# Patient Record
Sex: Male | Born: 1955 | Race: White | Hispanic: No | Marital: Married | Smoking: Never smoker
Health system: Southern US, Community
[De-identification: ages and names within clinical notes are randomized; demographics above are authoritative.]

## PROBLEM LIST (undated history)

## (undated) DIAGNOSIS — K219 Gastro-esophageal reflux disease without esophagitis: Secondary | ICD-10-CM

## (undated) DIAGNOSIS — J189 Pneumonia, unspecified organism: Secondary | ICD-10-CM

## (undated) HISTORY — PX: NO PAST SURGERIES: SHX2092

---

## 2000-04-09 ENCOUNTER — Ambulatory Visit (HOSPITAL_COMMUNITY): Admission: RE | Admit: 2000-04-09 | Discharge: 2000-04-09 | Payer: Self-pay | Admitting: Family Medicine

## 2000-04-09 ENCOUNTER — Encounter: Payer: Self-pay | Admitting: Family Medicine

## 2011-05-03 ENCOUNTER — Ambulatory Visit (INDEPENDENT_AMBULATORY_CARE_PROVIDER_SITE_OTHER): Payer: BC Managed Care – PPO

## 2011-05-03 DIAGNOSIS — J029 Acute pharyngitis, unspecified: Secondary | ICD-10-CM

## 2013-06-12 ENCOUNTER — Ambulatory Visit (INDEPENDENT_AMBULATORY_CARE_PROVIDER_SITE_OTHER): Payer: BC Managed Care – PPO | Admitting: Podiatry

## 2013-06-12 ENCOUNTER — Encounter: Payer: Self-pay | Admitting: Podiatry

## 2013-06-12 VITALS — BP 136/82 | HR 76 | Ht 68.0 in | Wt 166.0 lb

## 2013-06-12 DIAGNOSIS — B351 Tinea unguium: Secondary | ICD-10-CM | POA: Insufficient documentation

## 2013-06-12 MED ORDER — TERBINAFINE HCL 250 MG PO TABS: 250.0000 mg | ORAL_TABLET | Freq: Every day | ORAL | Status: DC

## 2013-06-12 NOTE — Progress Notes (Signed)
Subjective: 58 year old male presents with fungal nail problem. He is on feet 10-12 hours/day. Fungal problem on 1st and 5th toes bilateral. Used OTC clear liquid for nail and failed to improve. Wishes to try out oral medication at this time.   Review of Systems - General ROS: negative for - chills, fatigue, fever, sleep disturbance, weight gain or weight loss Ophthalmic ROS: negative ENT ROS: negative Endocrine ROS: negative Respiratory ROS: no cough, shortness of breath, or wheezing Cardiovascular ROS: no chest pain or dyspnea on exertion Gastrointestinal ROS: no abdominal pain, change in bowel habits, or black or bloody stools Genito-Urinary ROS: no dysuria, trouble voiding, or hematuria Musculoskeletal ROS: negative Neurological ROS: no TIA or stroke symptoms Dermatological ROS: negative  Objective: Fungal nails 1st and 5th bilateral.  Neurovascular status are within normal. Rectus foot without gross deformities.  Assessment: Onychomycosis 1st and 5th bilateral.   Plan: Reviewed clinical findings available treatment options. Blood drawn for Liver enzyme profile. Rx for Lamisil given.

## 2013-06-12 NOTE — Patient Instructions (Signed)
Seen for fungal nails. Treatment options reviewed. Lamisil tablet prescribed. Return in 6 weeks for repeat blood work.

## 2013-06-27 ENCOUNTER — Encounter: Payer: Self-pay | Admitting: *Deleted

## 2013-06-27 ENCOUNTER — Other Ambulatory Visit: Payer: Self-pay | Admitting: Podiatry

## 2013-07-28 ENCOUNTER — Ambulatory Visit (INDEPENDENT_AMBULATORY_CARE_PROVIDER_SITE_OTHER): Payer: BC Managed Care – PPO | Admitting: Podiatry

## 2013-07-28 ENCOUNTER — Encounter: Payer: Self-pay | Admitting: Podiatry

## 2013-07-28 VITALS — BP 116/80 | HR 59

## 2013-07-28 DIAGNOSIS — M79609 Pain in unspecified limb: Secondary | ICD-10-CM

## 2013-07-28 DIAGNOSIS — B351 Tinea unguium: Secondary | ICD-10-CM

## 2013-07-28 NOTE — Progress Notes (Signed)
Patient came in to have blood work done. He has taken Lamisil for toe nail fungus for a month. No new problems.  Blood drawn and sample sent to lab for Liver enzyme function.  Will repeat in a month.

## 2013-07-28 NOTE — Patient Instructions (Signed)
Follow up Lamisil treatment. Blood drawn for liver function. Repeat in one month.

## 2013-08-10 ENCOUNTER — Other Ambulatory Visit: Payer: Self-pay | Admitting: *Deleted

## 2013-08-25 ENCOUNTER — Encounter: Payer: Self-pay | Admitting: Podiatry

## 2013-08-25 ENCOUNTER — Ambulatory Visit (INDEPENDENT_AMBULATORY_CARE_PROVIDER_SITE_OTHER): Payer: BC Managed Care – PPO | Admitting: Podiatry

## 2013-08-25 VITALS — BP 117/72 | HR 61 | Ht 68.0 in | Wt 168.0 lb

## 2013-08-25 DIAGNOSIS — B351 Tinea unguium: Secondary | ICD-10-CM

## 2013-08-25 NOTE — Patient Instructions (Signed)
Blood drawn to monitor Hepatic function. Return in 2 month for follow up.

## 2013-08-25 NOTE — Progress Notes (Signed)
Follow up on Lamisil treatment on fungal nail. Doing well with medication. Here to have blood work repeated.

## 2013-09-04 ENCOUNTER — Other Ambulatory Visit: Payer: Self-pay | Admitting: *Deleted

## 2013-10-27 ENCOUNTER — Encounter: Payer: Self-pay | Admitting: Podiatry

## 2013-10-27 ENCOUNTER — Ambulatory Visit (INDEPENDENT_AMBULATORY_CARE_PROVIDER_SITE_OTHER): Payer: BC Managed Care – PPO | Admitting: Podiatry

## 2013-10-27 VITALS — BP 128/79 | HR 78

## 2013-10-27 DIAGNOSIS — B351 Tinea unguium: Secondary | ICD-10-CM

## 2013-10-27 MED ORDER — EFINACONAZOLE 10 % EX SOLN: 1.0000 | Freq: Every morning | CUTANEOUS | Status: DC

## 2013-10-27 NOTE — Patient Instructions (Signed)
Improved fungal nail. Will continue with Jublia.

## 2013-10-27 NOTE — Progress Notes (Signed)
Follow up visit after using Lamisil for 3 months. Stated that he noted of improvement in his toe nail fungus.   Objective: Clear normal nail plate at proximal 1/2.   Assessment: Improved fungal nail with Lamisil treatment.    Plan: Advised to continue antifungal treatment with topical. Jublia prescribed.

## 2013-12-07 ENCOUNTER — Telehealth: Payer: Self-pay | Admitting: *Deleted

## 2013-12-07 NOTE — Telephone Encounter (Signed)
Dr. Caffie Pinto , [place] called today, 12/07/13 . He never received his Jublia, wants the RX sent into his Pharmacy. I explained that it was very expensive, He actually got a coupon and wants to [place] it. I told him I would have you send it in to his pharmacy as requested, then go thru [facility] if his Ins doesn't cover.

## 2013-12-08 MED ORDER — EFINACONAZOLE 10 % EX SOLN: 1.0000 | Freq: Every morning | CUTANEOUS | Status: DC

## 2014-01-18 ENCOUNTER — Ambulatory Visit (INDEPENDENT_AMBULATORY_CARE_PROVIDER_SITE_OTHER): Payer: BC Managed Care – PPO | Admitting: Family Medicine

## 2014-01-18 VITALS — BP 140/90 | HR 77 | Temp 98.1°F | Resp 18 | Ht 68.0 in | Wt 164.0 lb

## 2014-01-18 DIAGNOSIS — S61216A Laceration without foreign body of right little finger without damage to nail, initial encounter: Secondary | ICD-10-CM

## 2014-01-18 DIAGNOSIS — M79644 Pain in right finger(s): Secondary | ICD-10-CM

## 2014-01-18 DIAGNOSIS — Z23 Encounter for immunization: Secondary | ICD-10-CM

## 2014-01-18 NOTE — Progress Notes (Signed)
Procedure: Consent obtained.  Metacarpal block with 2% Lidocaine.  Anesthesia locally obtained.  Wound cleaned with soap and water.  Wound explored.  No tendons visualized.  Slice-like superficial wound.  Superficial closure with 5-0 ethilon #3 horizontal mattress sutures.   Dressings placed.  Wound care discussed with patient.

## 2014-01-18 NOTE — Patient Instructions (Signed)

## 2014-01-18 NOTE — Progress Notes (Signed)
Subjective:    Patient ID: Ian Walsh, male    DOB: [birth year], 58 y.o.   MRN: [id number] This chart was scribed for Wendie Agreste, MD by Martinique Peace, ED Scribe. The patient was seen in [facility]07. The patient's care was started at 8:48 PM.  HPI HPI Comments: Ian Walsh is a 58 y.o. male who presents to the [facility] complaining of laceration to right 5th finger onset 10 or 11 this morning that occurred while pt was closing a gate. He states that his finger got caught between the gate and a post. He denies experiencing any bone pain and adds that he is able to use finger normally. Unknown last Tetanus but thinks more than 5 years ago. Pt states that he washed wound and applied bandage to affected area. No other treatments.   Patient Active Problem List   Diagnosis Date Noted  . Onychomycosis 06/12/2013   History reviewed. No pertinent past medical history. History reviewed. No pertinent past surgical history. Allergies  Allergen Reactions  . Codeine Nausea Only   Prior to Admission medications   Medication Sig Start Date End Date Taking? Authorizing Provider  aspirin 81 MG tablet Take 81 mg by mouth daily.   Yes Historical Provider, MD  Efinaconazole (JUBLIA) 10 % SOLN Apply 1 applicator topically every morning. 12/08/13   Myeong Sheard, DPM  Efinaconazole (JUBLIA) 10 % SOLN Apply 1 applicator topically every morning. 12/08/13   Myeong Sheard, DPM  terbinafine (LAMISIL) 250 MG tablet Take 1 tablet (250 mg total) by mouth daily. 06/12/13   Myeong Sheard, DPM   History   Social History  . Marital Status: Married    Spouse Name: N/A    Number of Children: N/A  . Years of Education: N/A   Occupational History  . Not on file.   Social History Main Topics  . Smoking status: Former Research scientist (life sciences)  . Smokeless tobacco: Never Used  . Alcohol Use: Not on file  . Drug Use: Not on file  . Sexual Activity: Not on file   Other Topics Concern  . Not on file   Social History Narrative  . No  narrative on file      Review of Systems  Skin: Positive for wound (laceration to right 5th finger).  Neurological: Negative for weakness.       Objective:   Physical Exam  Nursing note and vitals reviewed. Constitutional: He is oriented to person, place, and time. He appears well-developed and well-nourished. No distress.  HENT:  Head: Normocephalic and atraumatic.  Neck: Carotid bruit is not present.  Pulmonary/Chest: Effort normal and breath sounds normal. No respiratory distress.  Musculoskeletal: Normal range of motion. He exhibits no edema.  Right 5th finger, full flexion and extension strength at DIP and PIP. Full ROM, NVI distally. 2cm lac over volar 5th PIP with slight angle centrally. Widens with tension but hemostatic.   Neurological: He is alert and oriented to person, place, and time.  Skin: Skin is warm and dry.  Psychiatric: He has a normal mood and affect. His behavior is normal.    Medications - No data to display Filed Vitals:   01/18/14 2032  BP: 140/90  Pulse: 77  Temp: 98.1 F (36.7 C)  TempSrc: Oral  Resp: 18  Height: 5\' 8"  (1.727 m)  Weight: 164 lb (74.39 kg)  SpO2: 99%       Assessment & Plan:   Ian Walsh is a 58 y.o. male Laceration of finger,  right, initial encounter - Plan: Tdap vaccine greater than or equal to 7yo IM  Need for prophylactic vaccination with combined diphtheria-tetanus-pertussis (DTP) vaccine - Plan: Tdap vaccine greater than or equal to 7yo IM  Pain of finger of right hand Wound repaired per procedure note. tdap updated. No weakness or bony ttp. Declined XR.  Wound care and rtc precautions discussed.   No orders of the defined types were placed in this encounter.   Patient Instructions  WOUND CARE Please return in 10 days to have your stitches/staples removed or sooner if you have concerns. Marland Kitchen Keep area clean and dry for 24 hours. Do not remove bandage, if applied. . After 24 hours, remove bandage and wash  wound gently with mild soap and warm water. Reapply a new bandage after cleaning wound, if directed. . Continue daily cleansing with soap and water until stitches/staples are removed. . Do not apply any ointments or creams to the wound while stitches/staples are in place, as this may cause delayed healing. . Notify the office if you experience any of the following signs of infection: Swelling, redness, pus drainage, streaking, fever >101.0 F . Notify the office if you experience excessive bleeding that does not stop after 15-20 minutes of constant, firm pressure.      I personally performed the services described in this documentation, which was scribed in my presence. The recorded information has been reviewed and considered, and addended by me as needed.

## 2014-01-29 ENCOUNTER — Ambulatory Visit (INDEPENDENT_AMBULATORY_CARE_PROVIDER_SITE_OTHER): Payer: BC Managed Care – PPO | Admitting: Physician Assistant

## 2014-01-29 VITALS — BP 132/80 | HR 69 | Temp 98.8°F | Resp 16

## 2014-01-29 DIAGNOSIS — S61216D Laceration without foreign body of right little finger without damage to nail, subsequent encounter: Secondary | ICD-10-CM

## 2014-01-29 NOTE — Progress Notes (Signed)
   Subjective:    Patient ID: Ian Walsh, male    DOB: 1955/09/10, 58 y.o.   MRN: [id number]  Suture / Staple Removal  Patient presents to clinic for suture removal status post 11 days. Patient denies pain.     Review of Systems     Objective:   Physical Exam  Wound of finger clean, intact, and non erythematous. 3 horizontal mattress sutures removed.  Blood pressure 132/80, pulse 69, temperature 98.8 F (37.1 C), temperature source Oral, resp. rate 16, SpO2 98.00%.       Assessment & Plan:  Sutures removed. Procedure well tolerated. Local wound care follow up as needed.

## 2014-01-29 NOTE — Progress Notes (Signed)
I have discussed this patient with Ms. Ricki Miller, PA-C and agree.

## 2017-02-04 DIAGNOSIS — Z23 Encounter for immunization: Secondary | ICD-10-CM | POA: Diagnosis not present

## 2017-06-02 DIAGNOSIS — R739 Hyperglycemia, unspecified: Secondary | ICD-10-CM | POA: Diagnosis not present

## 2017-06-02 DIAGNOSIS — Z0000 Encounter for general adult medical examination without abnormal findings: Secondary | ICD-10-CM | POA: Diagnosis not present

## 2017-06-02 DIAGNOSIS — Z125 Encounter for screening for malignant neoplasm of prostate: Secondary | ICD-10-CM | POA: Diagnosis not present

## 2018-01-02 DIAGNOSIS — T1502XA Foreign body in cornea, left eye, initial encounter: Secondary | ICD-10-CM | POA: Diagnosis not present

## 2018-01-03 DIAGNOSIS — T1502XA Foreign body in cornea, left eye, initial encounter: Secondary | ICD-10-CM | POA: Diagnosis not present

## 2018-01-03 DIAGNOSIS — H5712 Ocular pain, left eye: Secondary | ICD-10-CM | POA: Diagnosis not present

## 2018-01-05 DIAGNOSIS — T1502XD Foreign body in cornea, left eye, subsequent encounter: Secondary | ICD-10-CM | POA: Diagnosis not present

## 2018-01-13 DIAGNOSIS — S0502XD Injury of conjunctiva and corneal abrasion without foreign body, left eye, subsequent encounter: Secondary | ICD-10-CM | POA: Diagnosis not present

## 2018-01-24 DIAGNOSIS — S0502XD Injury of conjunctiva and corneal abrasion without foreign body, left eye, subsequent encounter: Secondary | ICD-10-CM | POA: Diagnosis not present

## 2018-02-03 DIAGNOSIS — H18832 Recurrent erosion of cornea, left eye: Secondary | ICD-10-CM | POA: Diagnosis not present

## 2018-02-08 DIAGNOSIS — H18832 Recurrent erosion of cornea, left eye: Secondary | ICD-10-CM | POA: Diagnosis not present

## 2018-03-07 DIAGNOSIS — H18832 Recurrent erosion of cornea, left eye: Secondary | ICD-10-CM | POA: Diagnosis not present

## 2018-04-12 ENCOUNTER — Other Ambulatory Visit: Payer: Self-pay

## 2018-04-12 ENCOUNTER — Inpatient Hospital Stay (HOSPITAL_COMMUNITY): Payer: 59

## 2018-04-12 ENCOUNTER — Emergency Department (HOSPITAL_COMMUNITY): Payer: 59

## 2018-04-12 ENCOUNTER — Inpatient Hospital Stay (HOSPITAL_COMMUNITY)
Admission: EM | Admit: 2018-04-12 | Discharge: 2018-04-15 | DRG: 373 | Disposition: A | Discharge status: Home / Self Care | Payer: 59 | Attending: Surgery | Admitting: Surgery

## 2018-04-12 ENCOUNTER — Encounter (HOSPITAL_COMMUNITY): Payer: Self-pay | Admitting: Emergency Medicine

## 2018-04-12 DIAGNOSIS — K5900 Constipation, unspecified: Secondary | ICD-10-CM | POA: Diagnosis present

## 2018-04-12 DIAGNOSIS — Z87891 Personal history of nicotine dependence: Secondary | ICD-10-CM | POA: Diagnosis not present

## 2018-04-12 DIAGNOSIS — Z79899 Other long term (current) drug therapy: Secondary | ICD-10-CM

## 2018-04-12 DIAGNOSIS — K219 Gastro-esophageal reflux disease without esophagitis: Secondary | ICD-10-CM | POA: Diagnosis present

## 2018-04-12 DIAGNOSIS — R911 Solitary pulmonary nodule: Secondary | ICD-10-CM | POA: Diagnosis present

## 2018-04-12 DIAGNOSIS — K3589 Other acute appendicitis without perforation or gangrene: Secondary | ICD-10-CM

## 2018-04-12 DIAGNOSIS — K3532 Acute appendicitis with perforation and localized peritonitis, without abscess: Secondary | ICD-10-CM | POA: Diagnosis present

## 2018-04-12 DIAGNOSIS — B9620 Unspecified Escherichia coli [E. coli] as the cause of diseases classified elsewhere: Secondary | ICD-10-CM | POA: Diagnosis present

## 2018-04-12 DIAGNOSIS — K449 Diaphragmatic hernia without obstruction or gangrene: Secondary | ICD-10-CM | POA: Diagnosis present

## 2018-04-12 DIAGNOSIS — Z7982 Long term (current) use of aspirin: Secondary | ICD-10-CM

## 2018-04-12 DIAGNOSIS — Z23 Encounter for immunization: Secondary | ICD-10-CM | POA: Diagnosis not present

## 2018-04-12 DIAGNOSIS — R1031 Right lower quadrant pain: Secondary | ICD-10-CM | POA: Diagnosis not present

## 2018-04-12 DIAGNOSIS — K3533 Acute appendicitis with perforation and localized peritonitis, with abscess: Principal | ICD-10-CM | POA: Diagnosis present

## 2018-04-12 HISTORY — DX: Pneumonia, unspecified organism: J18.9

## 2018-04-12 HISTORY — DX: Gastro-esophageal reflux disease without esophagitis: K21.9

## 2018-04-12 LAB — CBC WITH DIFFERENTIAL/PLATELET
Abs Immature Granulocytes: 0.09 10*3/uL — ABNORMAL HIGH (ref 0.00–0.07)
BASOS ABS: 0 10*3/uL (ref 0.0–0.1)
BASOS PCT: 0 %
EOS ABS: 0 10*3/uL (ref 0.0–0.5)
Eosinophils Relative: 0 %
HCT: 45.2 % (ref 39.0–52.0)
Hemoglobin: 15 g/dL (ref 13.0–17.0)
IMMATURE GRANULOCYTES: 1 %
Lymphocytes Relative: 9 %
Lymphs Abs: 1.3 10*3/uL (ref 0.7–4.0)
MCH: 31.3 pg (ref 26.0–34.0)
MCHC: 33.2 g/dL (ref 30.0–36.0)
MCV: 94.4 fL (ref 80.0–100.0)
Monocytes Absolute: 1.1 10*3/uL — ABNORMAL HIGH (ref 0.1–1.0)
Monocytes Relative: 8 %
NRBC: 0 % (ref 0.0–0.2)
Neutro Abs: 12 10*3/uL — ABNORMAL HIGH (ref 1.7–7.7)
Neutrophils Relative %: 82 %
PLATELETS: 378 10*3/uL (ref 150–400)
RBC: 4.79 MIL/uL (ref 4.22–5.81)
RDW: 11.8 % (ref 11.5–15.5)
WBC: 14.5 10*3/uL — AB (ref 4.0–10.5)

## 2018-04-12 LAB — BASIC METABOLIC PANEL
ANION GAP: 14 (ref 5–15)
BUN: 15 mg/dL (ref 6–20)
CALCIUM: 9.4 mg/dL (ref 8.9–10.3)
CO2: 26 mmol/L (ref 22–32)
CREATININE: 1.1 mg/dL (ref 0.61–1.24)
Chloride: 99 mmol/L (ref 98–111)
GFR calc non Af Amer: >60 mL/min (ref >60)
Glucose, Bld: 111 mg/dL — ABNORMAL HIGH (ref 70–99)
Potassium: 4.1 mmol/L (ref 3.5–5.1)
Sodium: 139 mmol/L (ref 135–145)

## 2018-04-12 LAB — URINALYSIS, ROUTINE W REFLEX MICROSCOPIC
BILIRUBIN URINE: NEGATIVE
Glucose, UA: NEGATIVE mg/dL
Hgb urine dipstick: NEGATIVE
Ketones, ur: 20 mg/dL — AB
Leukocytes, UA: NEGATIVE
Nitrite: NEGATIVE
Protein, ur: NEGATIVE mg/dL
Specific Gravity, Urine: >1.046 — ABNORMAL HIGH (ref 1.005–1.030)
pH: 5 (ref 5.0–8.0)

## 2018-04-12 LAB — CREATININE, SERUM
Creatinine, Ser: 1.08 mg/dL (ref 0.61–1.24)
GFR calc non Af Amer: >60 mL/min (ref >60)

## 2018-04-12 LAB — CBC
HEMATOCRIT: 41 % (ref 39.0–52.0)
Hemoglobin: 14.2 g/dL (ref 13.0–17.0)
MCH: 31.9 pg (ref 26.0–34.0)
MCHC: 34.6 g/dL (ref 30.0–36.0)
MCV: 92.1 fL (ref 80.0–100.0)
Platelets: 352 10*3/uL (ref 150–400)
RBC: 4.45 MIL/uL (ref 4.22–5.81)
RDW: 11.6 % (ref 11.5–15.5)
WBC: 13.7 10*3/uL — ABNORMAL HIGH (ref 4.0–10.5)
nRBC: 0 % (ref 0.0–0.2)

## 2018-04-12 LAB — PROTIME-INR
INR: 1.08
Prothrombin Time: 13.9 seconds (ref 11.4–15.2)

## 2018-04-12 MED ORDER — ONDANSETRON HCL 4 MG/2ML IJ SOLN
4.0000 mg | Freq: Four times a day (QID) | INTRAMUSCULAR | Status: DC | PRN
  Administered 2018-04-12: 4 mg via INTRAVENOUS
  Filled 2018-04-12: qty 2

## 2018-04-12 MED ORDER — DIPHENHYDRAMINE HCL 50 MG/ML IJ SOLN: 12.5000 mg | Freq: Four times a day (QID) | INTRAMUSCULAR | Status: DC | PRN

## 2018-04-12 MED ORDER — SODIUM CHLORIDE 0.9 % IV SOLN
INTRAVENOUS | Status: DC
  Administered 2018-04-12: 14:00:00 via INTRAVENOUS

## 2018-04-12 MED ORDER — INFLUENZA VAC SPLIT QUAD 0.5 ML IM SUSY
0.5000 mL | PREFILLED_SYRINGE | INTRAMUSCULAR | Status: AC
  Administered 2018-04-15: 0.5 mL via INTRAMUSCULAR
  Filled 2018-04-12 (×2): qty 0.5

## 2018-04-12 MED ORDER — KCL IN DEXTROSE-NACL 20-5-0.9 MEQ/L-%-% IV SOLN
INTRAVENOUS | Status: DC
  Administered 2018-04-12 – 2018-04-14 (×5): via INTRAVENOUS
  Filled 2018-04-12 (×9): qty 1000

## 2018-04-12 MED ORDER — LIDOCAINE HCL 1 % IJ SOLN
INTRAMUSCULAR | Status: AC
  Filled 2018-04-12: qty 20

## 2018-04-12 MED ORDER — ENOXAPARIN SODIUM 40 MG/0.4ML ~~LOC~~ SOLN
40.0000 mg | SUBCUTANEOUS | Status: DC
  Administered 2018-04-13 – 2018-04-15 (×3): 40 mg via SUBCUTANEOUS
  Filled 2018-04-12 (×3): qty 0.4

## 2018-04-12 MED ORDER — TRAMADOL HCL 50 MG PO TABS
50.0000 mg | ORAL_TABLET | Freq: Four times a day (QID) | ORAL | Status: DC | PRN
  Administered 2018-04-12: 50 mg via ORAL
  Filled 2018-04-12: qty 1

## 2018-04-12 MED ORDER — FENTANYL CITRATE (PF) 100 MCG/2ML IJ SOLN
INTRAMUSCULAR | Status: AC
  Filled 2018-04-12: qty 4

## 2018-04-12 MED ORDER — BOOST / RESOURCE BREEZE PO LIQD CUSTOM
1.0000 | Freq: Three times a day (TID) | ORAL | Status: DC
  Administered 2018-04-12 – 2018-04-14 (×5): 1 via ORAL

## 2018-04-12 MED ORDER — MIDAZOLAM HCL 2 MG/2ML IJ SOLN
INTRAMUSCULAR | Status: AC | PRN
  Administered 2018-04-12 (×2): 1 mg via INTRAVENOUS

## 2018-04-12 MED ORDER — IOHEXOL 300 MG/ML  SOLN
100.0000 mL | Freq: Once | INTRAMUSCULAR | Status: AC | PRN
  Administered 2018-04-12: 100 mL via INTRAVENOUS

## 2018-04-12 MED ORDER — ACETAMINOPHEN 650 MG RE SUPP: 650.0000 mg | Freq: Four times a day (QID) | RECTAL | Status: DC | PRN

## 2018-04-12 MED ORDER — ONDANSETRON 4 MG PO TBDP: 4.0000 mg | ORAL_TABLET | Freq: Four times a day (QID) | ORAL | Status: DC | PRN

## 2018-04-12 MED ORDER — MIDAZOLAM HCL 2 MG/2ML IJ SOLN
INTRAMUSCULAR | Status: AC
  Filled 2018-04-12: qty 4

## 2018-04-12 MED ORDER — SODIUM CHLORIDE 0.9% FLUSH
5.0000 mL | Freq: Three times a day (TID) | INTRAVENOUS | Status: DC
  Administered 2018-04-12 – 2018-04-15 (×9): 5 mL

## 2018-04-12 MED ORDER — MIDAZOLAM HCL 2 MG/2ML IJ SOLN
INTRAMUSCULAR | Status: AC
  Filled 2018-04-12: qty 2

## 2018-04-12 MED ORDER — HYDROMORPHONE HCL 1 MG/ML IJ SOLN: 0.5000 mg | INTRAMUSCULAR | Status: DC | PRN

## 2018-04-12 MED ORDER — PIPERACILLIN-TAZOBACTAM 3.375 G IVPB
3.3750 g | Freq: Three times a day (TID) | INTRAVENOUS | Status: DC
  Administered 2018-04-12 – 2018-04-15 (×8): 3.375 g via INTRAVENOUS
  Filled 2018-04-12 (×5): qty 50

## 2018-04-12 MED ORDER — PIPERACILLIN-TAZOBACTAM 3.375 G IVPB 30 MIN
3.3750 g | Freq: Once | INTRAVENOUS | Status: AC
  Administered 2018-04-12: 3.375 g via INTRAVENOUS
  Filled 2018-04-12: qty 50

## 2018-04-12 MED ORDER — DIPHENHYDRAMINE HCL 12.5 MG/5ML PO ELIX: 12.5000 mg | ORAL_SOLUTION | Freq: Four times a day (QID) | ORAL | Status: DC | PRN

## 2018-04-12 MED ORDER — FENTANYL CITRATE (PF) 100 MCG/2ML IJ SOLN
INTRAMUSCULAR | Status: AC | PRN
  Administered 2018-04-12 (×2): 25 ug via INTRAVENOUS
  Administered 2018-04-12: 50 ug via INTRAVENOUS

## 2018-04-12 MED ORDER — ACETAMINOPHEN 325 MG PO TABS
650.0000 mg | ORAL_TABLET | Freq: Four times a day (QID) | ORAL | Status: DC | PRN
  Administered 2018-04-12: 650 mg via ORAL
  Filled 2018-04-12: qty 2

## 2018-04-12 NOTE — ED Triage Notes (Signed)
Patient arrived from home reporting he went to urgent care and was sent to the ED because he may have appendictis.   Patient reports abdominal pain for a week with fever, he reported taking Naproxen and felt better for about 2 days but returned. He reports that he has not had any appetite, feeling constipated and he had to go to the Urgent Med this morning

## 2018-04-12 NOTE — Sedation Documentation (Signed)
Pt being prepped for procedure at this time 

## 2018-04-12 NOTE — Progress Notes (Signed)
Pt new admit from IR abscess drain mid abdomen with one JP drain, alert and oriented , independent, given 1x ultram for pain and relieved, wife at the bedside.

## 2018-04-12 NOTE — Consult Note (Signed)
Chief Complaint: Patient was seen in consultation today for RLQ abscess drain placement at the request of [facility]   Supervising Physician: Daryll Brod  Patient Status: [facility] - ED  History of Present Illness: Ian Walsh is a 62 y.o. male   abd pain x 1 week Intermittent RLQ pain Sent to ED from Urgent Care for possible appendicitis  Leukocytosis CT today:  IMPRESSION: 1. Heterogeneous periappendiceal fluid collection compatible with contained rupture/abscess measuring up to 6.6 cm. 2. Thickening the appendix compatible with acute/subacute appendicitis. 3. Secondary inflammatory changes involving the terminal ileum and sigmoid colon adjacent to the abscess. 4. Moderate-sized hiatal hernia. 5. Semi-solid right lower lobe pulmonary nodule measures 9 x 8 x 13 mm. Follow-up non-contrast CT recommended at 3-6 months to confirm persistence. If unchanged, and solid component remains <6 mm, annual CT is recommended until 5 years of stability has been established. If persistent these nodules should be considered highly suspicious if the solid component of the nodule is 6 mm or greater in size and enlarging.   Now scheduled for abscess drain placement Dr Annamaria Boots has reviewed imaging and approves procedure   Past Medical History:  Diagnosis Date  . GERD (gastroesophageal reflux disease)     History reviewed. No pertinent surgical history.  Allergies: Codeine  Medications: Prior to Admission medications   Medication Sig Start Date End Date Taking? Authorizing Provider  acetaminophen (TYLENOL) 500 MG tablet Take 1,000 mg by mouth every 6 (six) hours as needed for fever.   Yes [provider]  aspirin 81 MG tablet Take 81 mg by mouth daily.   Yes [provider]  calcium carbonate (TUMS - DOSED IN MG ELEMENTAL CALCIUM) 500 MG chewable tablet Chew 1,500 tablets by mouth as needed for indigestion or heartburn.   Yes [provider]  naproxen  (NAPROSYN) 500 MG tablet Take 250 mg by mouth as needed for mild pain or moderate pain.   Yes [provider]  omeprazole (PRILOSEC) 20 MG capsule Take 20 mg by mouth as needed (reflux).    Yes [provider]  polyethylene glycol (MIRALAX / GLYCOLAX) packet Take 17 g by mouth daily as needed for mild constipation or moderate constipation.   Yes [provider]  Efinaconazole (JUBLIA) 10 % SOLN Apply 1 applicator topically every morning. Patient not taking: Reported on 04/12/2018 12/08/13   Sheard, Myeong O, DPM  terbinafine (LAMISIL) 250 MG tablet Take 1 tablet (250 mg total) by mouth daily. Patient not taking: Reported on 04/12/2018 06/12/13   Camelia Phenes, DPM     History reviewed. No pertinent family history.  Social History   Socioeconomic History  . Marital status: Married    Spouse name: Not on file  . Number of children: Not on file  . Years of education: Not on file  . Highest education level: Not on file  Occupational History  . Not on file  Social Needs  . Financial resource strain: Not on file  . Food insecurity:    Worry: Not on file    Inability: Not on file  . Transportation needs:    Medical: Not on file    Non-medical: Not on file  Tobacco Use  . Smoking status: Former Research scientist (life sciences)  . Smokeless tobacco: Never Used  Substance and Sexual Activity  . Alcohol use: Yes    Comment: occasional  . Drug use: Never  . Sexual activity: Yes  Lifestyle  . Physical activity:    Days per week:  Not on file    Minutes per session: Not on file  . Stress: Not on file  Relationships  . Social connections:    Talks on phone: Not on file    Gets together: Not on file    Attends religious service: Not on file    Active member of club or organization: Not on file    Attends meetings of clubs or organizations: Not on file    Relationship status: Not on file  Other Topics Concern  . Not on file  Social History Narrative  . Not on file     Review  of Systems: A 12 point ROS discussed and pertinent positives are indicated in the HPI above.  All other systems are negative.  Review of Systems  Constitutional: Positive for appetite change. Negative for fatigue and fever.  Respiratory: Negative for shortness of breath.   Cardiovascular: Negative for chest pain.  Gastrointestinal: Positive for abdominal pain.  Psychiatric/Behavioral: Negative for behavioral problems and confusion.    Vital Signs: BP 124/79   Pulse 86   Temp 98.1 F (36.7 C) (Oral)   Resp 18   Ht 5\' 8"  (1.727 m)   Wt 158 lb 5 oz (71.8 kg)   SpO2 99%   BMI 24.07 kg/m   Physical Exam Vitals signs reviewed.  Cardiovascular:     Rate and Rhythm: Normal rate and regular rhythm.  Pulmonary:     Effort: Pulmonary effort is normal.     Breath sounds: Normal breath sounds.  Abdominal:     General: Bowel sounds are normal.     Palpations: Abdomen is soft.  Musculoskeletal: Normal range of motion.  Skin:    General: Skin is warm and dry.  Neurological:     General: No focal deficit present.     Mental Status: He is oriented to person, place, and time.  Psychiatric:        Mood and Affect: Mood normal.        Behavior: Behavior normal.        Thought Content: Thought content normal.        Judgment: Judgment normal.     Imaging: Ct Abdomen Pelvis W Contrast  Result Date: 04/12/2018 CLINICAL DATA:  Right lower quadrant pain for 1 week. Intermittent fever. Abdominal distention. EXAM: CT ABDOMEN AND PELVIS WITH CONTRAST TECHNIQUE: Multidetector CT imaging of the abdomen and pelvis was performed using the standard protocol following bolus administration of intravenous contrast. CONTRAST:  127mL OMNIPAQUE IOHEXOL 300 MG/ML  SOLN COMPARISON:  None FINDINGS: Lower chest: The semi solid nodule in the right lower lobe measures 9 x 8 x 13 mm. A high-density punctate nodule is present on image 15 of series 5. There is minimal ground-glass attenuation in the medial left  lower lobe. Heart size is normal. No significant pleural or pericardial effusion is present. Hepatobiliary: Fatty infiltration of the liver is noted. No discrete lesions are present. The common bile duct and gallbladder normal. Pancreas: Unremarkable. No pancreatic ductal dilatation or surrounding inflammatory changes. Spleen: Normal in size without focal abnormality. Adrenals/Urinary Tract: Adrenal glands are normal bilaterally. Kidneys are unremarkable. There is no stone or mass lesion. Ureters are within normal limits. The urinary bladder is normal. Stomach/Bowel: A moderate-sized hiatal hernia is present. The stomach is otherwise normal. Duodenum is unremarkable. The proximal small bowel is normal. Marked inflammatory changes are present at the level of the appendix. The appendix is thickened, measuring up to 9 mm. A peri appendiceal mixed density  collection measures 5.0 x 6.6 x 4.0 cm. There is secondary and inflammatory changes about the terminal ileum as well as the sigmoid colon. The more distal ascending and transverse colon are within normal limits. Descending colon is unremarkable. Secondary inflammatory changes are present about portions of the sigmoid colon adjacent to the abscess. Vascular/Lymphatic: No significant vascular findings are present. No enlarged abdominal or pelvic lymph nodes. Reproductive: Prostate is unremarkable. Other: No abdominal wall hernia or abnormality. No abdominopelvic ascites. Musculoskeletal: Mild endplate changes are present at L5-S1. Bony pelvis is intact. The hips are located and within normal limits bilaterally. IMPRESSION: 1. Heterogeneous periappendiceal fluid collection compatible with contained rupture/abscess measuring up to 6.6 cm. 2. Thickening the appendix compatible with acute/subacute appendicitis. 3. Secondary inflammatory changes involving the terminal ileum and sigmoid colon adjacent to the abscess. 4. Moderate-sized hiatal hernia. 5. Semi-solid right lower  lobe pulmonary nodule measures 9 x 8 x 13 mm. Follow-up non-contrast CT recommended at 3-6 months to confirm persistence. If unchanged, and solid component remains <6 mm, annual CT is recommended until 5 years of stability has been established. If persistent these nodules should be considered highly suspicious if the solid component of the nodule is 6 mm or greater in size and enlarging. This recommendation follows the consensus statement: Guidelines for Management of Incidental Pulmonary Nodules Detected on CT Images: From the Fleischner Society 2017; Radiology 2017; 284:228-243. These results were called by telephone at the time of interpretation on 04/12/2018 at 1:04 pm to Dr. Lacretia Leigh , who verbally acknowledged these results. Electronically Signed   By: San Morelle M.D.   On: 04/12/2018 13:07    Labs:  CBC: Recent Labs    04/12/18 1138  WBC 14.5*  HGB 15.0  HCT 45.2  PLT 378    COAGS: No results for input(s): INR, APTT in the last 8760 hours.  BMP: Recent Labs    04/12/18 1138  NA 139  K 4.1  CL 99  CO2 26  GLUCOSE 111*  BUN 15  CALCIUM 9.4  CREATININE 1.10  GFRNONAA >60  GFRAA >60    LIVER FUNCTION TESTS: No results for input(s): BILITOT, AST, ALT, ALKPHOS, PROT, ALBUMIN in the last 8760 hours.  TUMOR MARKERS: No results for input(s): AFPTM, CEA, CA199, CHROMGRNA in the last 8760 hours.  Assessment and Plan:  Probable ruptured appendix RLQ abscess on CT Scheduled for abscess drain placement Risks and benefits discussed with the patient including bleeding, infection, damage to adjacent structures, bowel perforation/fistula connection, and sepsis.  All of the patient's questions were answered, patient is agreeable to proceed. Consent signed and in chart.  Thank you for this interesting consult.  I greatly enjoyed meeting Ian Walsh and look forward to participating in their care.  A copy of this report was sent to the requesting provider on this  date.  Electronically Signed: Lavonia Drafts, PA-C 04/12/2018, 2:20 PM   I spent a total of 40 Minutes    in face to face in clinical consultation, greater than 50% of which was counseling/coordinating care for RLQ abscess drain

## 2018-04-12 NOTE — Procedures (Signed)
appy abscess  S/p CT abscess drain  No comp Stable 40cc pus aspirated CX sent EBL min Full report in pacs

## 2018-04-12 NOTE — H&P (Signed)
Ian Walsh is an 62 y.o. male.   Chief Complaint: Abdominal pain for 1 week, anorexia, constipation HPI: Patient is a 62 year old male who presents with a week of intermittent right lower abdominal pain.  He has had some anorexia and constipation.  He has been treating it with NSAIDs.  He was seen in urgent care today because of ongoing symptoms and sent for possible acute appendicitis.  Work-up shows he is afebrile, vital signs are stable.  Labs shows a normal BMP.  WBC is 14.5, hemoglobin 15, hematocrit 45.2 platelets are 378,000.  CT of the abdomen shows a semisolid nodule in the right lower lobe measuring 9 x 8 x 13 mm.  He has a moderate sized hiatal hernia is inflammatory changes at the level of the appendix the appendix is thickened measuring up to 9 mm.  A periappendiceal mixed density collection measuring 5 x 6.6 x 4.0 cm, with secondary inflammatory changes about the terminal ileum as well as the sigmoid colon.  The distal ascending and transverse colons appear within normal limits descending colons unremarkable.  Consistent with a contained rupture/abscess of the appendix.  We are asked to see.    Past Medical History:  Diagnosis Date  . GERD (gastroesophageal reflux disease)     History reviewed. No pertinent surgical history.  History reviewed. No pertinent family history. Social History:  reports that he has quit smoking. He has never used smokeless tobacco. He reports current alcohol use. He reports that he does not use drugs.  Allergies:  Allergies  Allergen Reactions  . Codeine Nausea Only    Prior to Admission medications   Medication Sig Start Date End Date Taking? Authorizing Provider  acetaminophen (TYLENOL) 500 MG tablet Take 1,000 mg by mouth every 6 (six) hours as needed for fever.   Yes [provider]  aspirin 81 MG tablet Take 81 mg by mouth daily.   Yes [provider]  calcium carbonate (TUMS - DOSED IN MG ELEMENTAL CALCIUM) 500 MG  chewable tablet Chew 1,500 tablets by mouth as needed for indigestion or heartburn.   Yes [provider]  naproxen (NAPROSYN) 500 MG tablet Take 250 mg by mouth as needed for mild pain or moderate pain.   Yes [provider]  omeprazole (PRILOSEC) 20 MG capsule Take 20 mg by mouth as needed (reflux).    Yes [provider]  polyethylene glycol (MIRALAX / GLYCOLAX) packet Take 17 g by mouth daily as needed for mild constipation or moderate constipation.   Yes [provider]  Efinaconazole (JUBLIA) 10 % SOLN Apply 1 applicator topically every morning. Patient not taking: Reported on 04/12/2018 12/08/13   Sheard, Myeong O, DPM  terbinafine (LAMISIL) 250 MG tablet Take 1 tablet (250 mg total) by mouth daily. Patient not taking: Reported on 04/12/2018 06/12/13   Camelia Phenes, DPM     Results for orders placed or performed during the hospital encounter of 04/12/18 (from the past 48 hour(s))  CBC with Differential/Platelet     Status: Abnormal   Collection Time: 04/12/18 11:38 AM  Result Value Ref Range   WBC 14.5 (H) 4.0 - 10.5 K/uL   RBC 4.79 4.22 - 5.81 MIL/uL   Hemoglobin 15.0 13.0 - 17.0 g/dL   HCT 45.2 39.0 - 52.0 %   MCV 94.4 80.0 - 100.0 fL   MCH 31.3 26.0 - 34.0 pg   MCHC 33.2 30.0 - 36.0 g/dL   RDW 11.8 11.5 - 15.5 %  Platelets 378 150 - 400 K/uL   nRBC 0.0 0.0 - 0.2 %   Neutrophils Relative % 82 %   Neutro Abs 12.0 (H) 1.7 - 7.7 K/uL   Lymphocytes Relative 9 %   Lymphs Abs 1.3 0.7 - 4.0 K/uL   Monocytes Relative 8 %   Monocytes Absolute 1.1 (H) 0.1 - 1.0 K/uL   Eosinophils Relative 0 %   Eosinophils Absolute 0.0 0.0 - 0.5 K/uL   Basophils Relative 0 %   Basophils Absolute 0.0 0.0 - 0.1 K/uL   Immature Granulocytes 1 %   Abs Immature Granulocytes 0.09 (H) 0.00 - 0.07 K/uL    Comment: Performed at [facility] [address], [place], [place] [address]  Basic metabolic panel     Status: Abnormal   Collection Time: 04/12/18 11:38  AM  Result Value Ref Range   Sodium 139 135 - 145 mmol/L   Potassium 4.1 3.5 - 5.1 mmol/L   Chloride 99 98 - 111 mmol/L   CO2 26 22 - 32 mmol/L   Glucose, Bld 111 (H) 70 - 99 mg/dL   BUN 15 6 - 20 mg/dL   Creatinine, Ser 1.10 0.61 - 1.24 mg/dL   Calcium 9.4 8.9 - 10.3 mg/dL   GFR calc non Af Amer >60 >60 mL/min   GFR calc Af Amer >60 >60 mL/min   Anion gap 14 5 - 15    Comment: Performed at [facility] [address] [place], [place] [address]   Ct Abdomen Pelvis W Contrast  Result Date: 04/12/2018 CLINICAL DATA:  Right lower quadrant pain for 1 week. Intermittent fever. Abdominal distention. EXAM: CT ABDOMEN AND PELVIS WITH CONTRAST TECHNIQUE: Multidetector CT imaging of the abdomen and pelvis was performed using the standard protocol following bolus administration of intravenous contrast. CONTRAST:  153mL OMNIPAQUE IOHEXOL 300 MG/ML  SOLN COMPARISON:  None FINDINGS: Lower chest: The semi solid nodule in the right lower lobe measures 9 x 8 x 13 mm. A high-density punctate nodule is present on image 15 of series 5. There is minimal ground-glass attenuation in the medial left lower lobe. Heart size is normal. No significant pleural or pericardial effusion is present. Hepatobiliary: Fatty infiltration of the liver is noted. No discrete lesions are present. The common bile duct and gallbladder normal. Pancreas: Unremarkable. No pancreatic ductal dilatation or surrounding inflammatory changes. Spleen: Normal in size without focal abnormality. Adrenals/Urinary Tract: Adrenal glands are normal bilaterally. Kidneys are unremarkable. There is no stone or mass lesion. Ureters are within normal limits. The urinary bladder is normal. Stomach/Bowel: A moderate-sized hiatal hernia is present. The stomach is otherwise normal. Duodenum is unremarkable. The proximal small bowel is normal. Marked inflammatory changes are present at the level of the appendix. The appendix is thickened, measuring up to 9 mm.  A peri appendiceal mixed density collection measures 5.0 x 6.6 x 4.0 cm. There is secondary and inflammatory changes about the terminal ileum as well as the sigmoid colon. The more distal ascending and transverse colon are within normal limits. Descending colon is unremarkable. Secondary inflammatory changes are present about portions of the sigmoid colon adjacent to the abscess. Vascular/Lymphatic: No significant vascular findings are present. No enlarged abdominal or pelvic lymph nodes. Reproductive: Prostate is unremarkable. Other: No abdominal wall hernia or abnormality. No abdominopelvic ascites. Musculoskeletal: Mild endplate changes are present at L5-S1. Bony pelvis is intact. The hips are located and within normal limits bilaterally. IMPRESSION: 1. Heterogeneous periappendiceal fluid collection compatible  with contained rupture/abscess measuring up to 6.6 cm. 2. Thickening the appendix compatible with acute/subacute appendicitis. 3. Secondary inflammatory changes involving the terminal ileum and sigmoid colon adjacent to the abscess. 4. Moderate-sized hiatal hernia. 5. Semi-solid right lower lobe pulmonary nodule measures 9 x 8 x 13 mm. Follow-up non-contrast CT recommended at 3-6 months to confirm persistence. If unchanged, and solid component remains <6 mm, annual CT is recommended until 5 years of stability has been established. If persistent these nodules should be considered highly suspicious if the solid component of the nodule is 6 mm or greater in size and enlarging. This recommendation follows the consensus statement: Guidelines for Management of Incidental Pulmonary Nodules Detected on CT Images: From the Fleischner Society 2017; Radiology 2017; 284:228-243. These results were called by telephone at the time of interpretation on 04/12/2018 at 1:04 pm to Dr. Lacretia Leigh , who verbally acknowledged these results. Electronically Signed   By: San Morelle M.D.   On: 04/12/2018 13:07     Review of Systems  Constitutional: Positive for chills. Negative for diaphoresis, fever, malaise/fatigue and weight loss.  HENT: Negative.   Eyes: Negative.   Respiratory: Negative.   Cardiovascular: Negative.   Gastrointestinal: Positive for abdominal pain and constipation.       Anorexia   Genitourinary: Negative.   Musculoskeletal: Negative.   Skin: Negative for itching and rash.  Neurological: Negative.   Endo/Heme/Allergies: Negative.   Psychiatric/Behavioral: Negative.     Blood pressure 122/84, pulse 92, temperature 98.1 F (36.7 C), temperature source Oral, resp. rate 18, height 5\' 8"  (1.727 m), weight 71.8 kg, SpO2 99 %. Physical Exam  Constitutional: He is oriented to person, place, and time. He appears well-developed and well-nourished. No distress.  HENT:  Head: Normocephalic and atraumatic.  Mouth/Throat: Oropharynx is clear and moist. No oropharyngeal exudate.  Eyes: Right eye exhibits no discharge. Left eye exhibits no discharge. No scleral icterus.  Pupils are equal  Neck: Normal range of motion. Neck supple. No JVD present. No tracheal deviation present. No thyromegaly present.  Cardiovascular: Normal rate, regular rhythm, normal heart sounds and intact distal pulses.  No murmur heard. Respiratory: Effort normal and breath sounds normal. No respiratory distress. He has no wheezes. He has no rales. He exhibits no tenderness.  GI: Soft. Bowel sounds are normal. He exhibits no distension and no mass. There is abdominal tenderness (some in the RLQ with palpation). There is no rebound and no guarding (minimal RLQ).  Musculoskeletal:        General: No tenderness or edema.  Lymphadenopathy:    He has no cervical adenopathy.  Neurological: He is alert and oriented to person, place, and time. No cranial nerve deficit.  Skin: Skin is warm and dry. No rash noted. He is not diaphoretic. No erythema. No pallor.  Psychiatric: He has a normal mood and affect. His  behavior is normal. Judgment and thought content normal.     Assessment/Plan Perforated appendix with abscess  Plan:  Admit, IV antibiotics, IV hydration, IR drain placement  Lauri Purdum, PA-C 04/12/2018, 1:24 PM

## 2018-04-12 NOTE — Sedation Documentation (Signed)
Procedure finished, pt tolerated very well.

## 2018-04-12 NOTE — ED Notes (Addendum)
Pt transferred to CT for guided drain with catheter placement. Pt will be transferred to 6N after procedure.

## 2018-04-12 NOTE — Sedation Documentation (Signed)
Procedure continues. Pt resting.

## 2018-04-12 NOTE — ED Provider Notes (Signed)
[facility] EMERGENCY DEPARTMENT Provider Note   CSN: [id number] Arrival date & time: 04/12/18  1120     History   Chief Complaint No chief complaint on file.   HPI Ian Walsh is a 62 y.o. male.  61 year old male presents with 1 week history of intermittent right lower quadrant abdominal pain.  Symptoms described as colicky and not associate with fever, emesis, diarrhea.  Denies any testicular pain or swelling.  Has had constipation recently.  Went to urgent care today and was sent here for evaluation of possible appendicitis.  His symptoms have been persistent nothing makes them better worse no treatment use prior to arrival.     Past Medical History:  Diagnosis Date  . GERD (gastroesophageal reflux disease)     Patient Active Problem List   Diagnosis Date Noted  . Onychomycosis 06/12/2013    History reviewed. No pertinent surgical history.      Home Medications    Prior to Admission medications   Medication Sig Start Date End Date Taking? Authorizing Provider  aspirin 81 MG tablet Take 81 mg by mouth daily.    [provider]  Efinaconazole (JUBLIA) 10 % SOLN Apply 1 applicator topically every morning. 12/08/13   Sheard, Myeong O, DPM  terbinafine (LAMISIL) 250 MG tablet Take 1 tablet (250 mg total) by mouth daily. 06/12/13   Sheard, Briscoe Burns, DPM    Family History History reviewed. No pertinent family history.  Social History Social History   Tobacco Use  . Smoking status: Former Research scientist (life sciences)  . Smokeless tobacco: Never Used  Substance Use Topics  . Alcohol use: Yes    Comment: occasional  . Drug use: Never     Allergies   Codeine   Review of Systems Review of Systems  All other systems reviewed and are negative.    Physical Exam Updated Vital Signs BP 111/87 (BP Location: Right Arm)   Pulse 87   Temp 98.1 F (36.7 C) (Oral)   Resp 18   Ht 1.727 m (5\' 8" )   Wt 71.8 kg   SpO2 100%   BMI 24.07 kg/m    Physical Exam Vitals signs and nursing note reviewed.  Constitutional:      General: He is not in acute distress.    Appearance: Normal appearance. He is well-developed. He is not toxic-appearing.  HENT:     Head: Normocephalic and atraumatic.  Eyes:     General: Lids are normal.     Conjunctiva/sclera: Conjunctivae normal.     Pupils: Pupils are equal, round, and reactive to light.  Neck:     Musculoskeletal: Normal range of motion and neck supple.     Thyroid: No thyroid mass.     Trachea: No tracheal deviation.  Cardiovascular:     Rate and Rhythm: Normal rate and regular rhythm.     Heart sounds: Normal heart sounds. No murmur. No gallop.   Pulmonary:     Effort: Pulmonary effort is normal. No respiratory distress.     Breath sounds: Normal breath sounds. No stridor. No decreased breath sounds, wheezing, rhonchi or rales.  Abdominal:     General: Bowel sounds are normal. There is no distension.     Palpations: Abdomen is soft.     Tenderness: There is abdominal tenderness in the right lower quadrant. There is guarding. There is no rebound.    Musculoskeletal: Normal range of motion.        General: No tenderness.  Skin:  General: Skin is warm and dry.     Findings: No abrasion or rash.  Neurological:     Mental Status: He is alert and oriented to person, place, and time.     GCS: GCS eye subscore is 4. GCS verbal subscore is 5. GCS motor subscore is 6.     Cranial Nerves: No cranial nerve deficit.     Sensory: No sensory deficit.  Psychiatric:        Speech: Speech normal.        Behavior: Behavior normal.      ED Treatments / Results  Labs (all labs ordered are listed, but only abnormal results are displayed) Labs Reviewed  URINE CULTURE  CBC WITH DIFFERENTIAL/PLATELET  BASIC METABOLIC PANEL  URINALYSIS, ROUTINE W REFLEX MICROSCOPIC    EKG None  Radiology No results found.  Procedures Procedures (including critical care time)  Medications  Ordered in ED Medications  0.9 %  sodium chloride infusion (has no administration in time range)     Initial Impression / Assessment and Plan / ED Course  I have reviewed the triage vital signs and the nursing notes.  Pertinent labs & imaging results that were available during my care of the patient were reviewed by me and considered in my medical decision making (see chart for details).     Abdominal CT consistent with ruptured appendicitis.  Patient started on IV antibiotics and will be admitted to the surgical service.  Final Clinical Impressions(s) / ED Diagnoses   Final diagnoses:  None    ED Discharge Orders    None       Lacretia Leigh, MD 04/12/18 1336

## 2018-04-12 NOTE — Sedation Documentation (Signed)
Pt is resting well at this time, procedure started

## 2018-04-13 LAB — COMPREHENSIVE METABOLIC PANEL
ALBUMIN: 2.4 g/dL — AB (ref 3.5–5.0)
ALT: 24 U/L (ref 0–44)
ANION GAP: 12 (ref 5–15)
AST: 10 U/L — ABNORMAL LOW (ref 15–41)
Alkaline Phosphatase: 122 U/L (ref 38–126)
BUN: 13 mg/dL (ref 6–20)
CO2: 25 mmol/L (ref 22–32)
Calcium: 8.5 mg/dL — ABNORMAL LOW (ref 8.9–10.3)
Chloride: 103 mmol/L (ref 98–111)
Creatinine, Ser: 0.99 mg/dL (ref 0.61–1.24)
GFR calc non Af Amer: >60 mL/min (ref >60)
GLUCOSE: 121 mg/dL — AB (ref 70–99)
Potassium: 4.3 mmol/L (ref 3.5–5.1)
SODIUM: 140 mmol/L (ref 135–145)
Total Bilirubin: 0.8 mg/dL (ref 0.3–1.2)
Total Protein: 6.2 g/dL — ABNORMAL LOW (ref 6.5–8.1)

## 2018-04-13 LAB — URINE CULTURE: Culture: NO GROWTH

## 2018-04-13 LAB — CBC
HCT: 37.1 % — ABNORMAL LOW (ref 39.0–52.0)
Hemoglobin: 12.7 g/dL — ABNORMAL LOW (ref 13.0–17.0)
MCH: 32.3 pg (ref 26.0–34.0)
MCHC: 34.2 g/dL (ref 30.0–36.0)
MCV: 94.4 fL (ref 80.0–100.0)
Platelets: 335 10*3/uL (ref 150–400)
RBC: 3.93 MIL/uL — ABNORMAL LOW (ref 4.22–5.81)
RDW: 11.8 % (ref 11.5–15.5)
WBC: 9.4 10*3/uL (ref 4.0–10.5)
nRBC: 0 % (ref 0.0–0.2)

## 2018-04-13 LAB — HIV ANTIBODY (ROUTINE TESTING W REFLEX): HIV Screen 4th Generation wRfx: NONREACTIVE

## 2018-04-13 LAB — MAGNESIUM: Magnesium: 2.2 mg/dL (ref 1.7–2.4)

## 2018-04-13 MED ORDER — ALUM & MAG HYDROXIDE-SIMETH 200-200-20 MG/5ML PO SUSP: 30.0000 mL | Freq: Four times a day (QID) | ORAL | Status: DC | PRN

## 2018-04-13 NOTE — Progress Notes (Signed)
Subjective: Alert.  Says he feels much better following placement of IR abscess drain One episode nausea and vomiting yesterday but that seems to have resolved. Ambulating to bathroom and voiding without difficulty Had one loose stool  Afebrile.  Heart rate 67.  Normotensive.  Drainage 115 cc bloody purulent fluid  Objective: Vital signs in last 24 hours: Temp:  [98.1 F (36.7 C)-100.1 F (37.8 C)] 98.6 F (37 C) (12/25 0435) Pulse Rate:  [67-98] 67 (12/25 0435) Resp:  [10-20] 18 (12/25 0435) BP: (105-126)/(65-99) 107/74 (12/25 0435) SpO2:  [92 %-100 %] 97 % (12/25 0435) Weight:  [71.8 kg] 71.8 kg (12/24 1133) Last BM Date: 04/11/18  Intake/Output from previous day: 12/24 0701 - 12/25 0700 In: 310 [P.O.:240; I.V.:5; IV Piggyback:50] Out: 365 [Urine:250; Drains:115] Intake/Output this shift: Total I/O In: 10 [Other:10] Out: 285 [Urine:250; Drains:35]  General appearance: Alert.  Minimal distress.  Mental status normal.  Wife present in room Resp: clear to auscultation bilaterally GI: Abdomen soft.  Nondistended.  Minimal tenderness.  Drain lower suprapubic area with bloody purulent fluid in bulb Extremities: extremities normal, atraumatic, no cyanosis or edema  Lab Results:  Recent Labs    04/12/18 1138 04/12/18 1557  WBC 14.5* 13.7*  HGB 15.0 14.2  HCT 45.2 41.0  PLT 378 352   BMET Recent Labs    04/12/18 1138 04/12/18 1557  NA 139  --   K 4.1  --   CL 99  --   CO2 26  --   GLUCOSE 111*  --   BUN 15  --   CREATININE 1.10 1.08  CALCIUM 9.4  --    PT/INR Recent Labs    04/12/18 1557  LABPROT 13.9  INR 1.08   ABG No results for input(s): PHART, HCO3 in the last 72 hours.  Invalid input(s): PCO2, PO2  Studies/Results: Ct Abdomen Pelvis W Contrast  Result Date: 04/12/2018 CLINICAL DATA:  Right lower quadrant pain for 1 week. Intermittent fever. Abdominal distention. EXAM: CT ABDOMEN AND PELVIS WITH CONTRAST TECHNIQUE: Multidetector CT  imaging of the abdomen and pelvis was performed using the standard protocol following bolus administration of intravenous contrast. CONTRAST:  163mL OMNIPAQUE IOHEXOL 300 MG/ML  SOLN COMPARISON:  None FINDINGS: Lower chest: The semi solid nodule in the right lower lobe measures 9 x 8 x 13 mm. A high-density punctate nodule is present on image 15 of series 5. There is minimal ground-glass attenuation in the medial left lower lobe. Heart size is normal. No significant pleural or pericardial effusion is present. Hepatobiliary: Fatty infiltration of the liver is noted. No discrete lesions are present. The common bile duct and gallbladder normal. Pancreas: Unremarkable. No pancreatic ductal dilatation or surrounding inflammatory changes. Spleen: Normal in size without focal abnormality. Adrenals/Urinary Tract: Adrenal glands are normal bilaterally. Kidneys are unremarkable. There is no stone or mass lesion. Ureters are within normal limits. The urinary bladder is normal. Stomach/Bowel: A moderate-sized hiatal hernia is present. The stomach is otherwise normal. Duodenum is unremarkable. The proximal small bowel is normal. Marked inflammatory changes are present at the level of the appendix. The appendix is thickened, measuring up to 9 mm. A peri appendiceal mixed density collection measures 5.0 x 6.6 x 4.0 cm. There is secondary and inflammatory changes about the terminal ileum as well as the sigmoid colon. The more distal ascending and transverse colon are within normal limits. Descending colon is unremarkable. Secondary inflammatory changes are present about portions of the sigmoid colon adjacent to the abscess.  Vascular/Lymphatic: No significant vascular findings are present. No enlarged abdominal or pelvic lymph nodes. Reproductive: Prostate is unremarkable. Other: No abdominal wall hernia or abnormality. No abdominopelvic ascites. Musculoskeletal: Mild endplate changes are present at L5-S1. Bony pelvis is intact. The  hips are located and within normal limits bilaterally. IMPRESSION: 1. Heterogeneous periappendiceal fluid collection compatible with contained rupture/abscess measuring up to 6.6 cm. 2. Thickening the appendix compatible with acute/subacute appendicitis. 3. Secondary inflammatory changes involving the terminal ileum and sigmoid colon adjacent to the abscess. 4. Moderate-sized hiatal hernia. 5. Semi-solid right lower lobe pulmonary nodule measures 9 x 8 x 13 mm. Follow-up non-contrast CT recommended at 3-6 months to confirm persistence. If unchanged, and solid component remains <6 mm, annual CT is recommended until 5 years of stability has been established. If persistent these nodules should be considered highly suspicious if the solid component of the nodule is 6 mm or greater in size and enlarging. This recommendation follows the consensus statement: Guidelines for Management of Incidental Pulmonary Nodules Detected on CT Images: From the Fleischner Society 2017; Radiology 2017; 284:228-243. These results were called by telephone at the time of interpretation on 04/12/2018 at 1:04 pm to Dr. Lacretia Leigh , who verbally acknowledged these results. Electronically Signed   By: San Morelle M.D.   On: 04/12/2018 13:07   Ct Image Guided Drainage By Percutaneous Catheter  Result Date: 04/12/2018 INDICATION: Perforated appendicitis with right lower quadrant abscess EXAM: CT DRAINAGE RIGHT LOWER QUADRANT ABSCESS MEDICATIONS: The patient is currently admitted to the hospital and receiving intravenous antibiotics. The antibiotics were administered within an appropriate time frame prior to the initiation of the procedure. ANESTHESIA/SEDATION: Fentanyl 100 mcg IV; Versed 2.0 mg IV Moderate Sedation Time:  15 minutes The patient was continuously monitored during the procedure by the interventional radiology nurse under my direct supervision. COMPLICATIONS: None immediate. PROCEDURE: Informed written consent was  obtained from the patient after a thorough discussion of the procedural risks, benefits and alternatives. All questions were addressed. Maximal Sterile Barrier Technique was utilized including caps, mask, sterile gowns, sterile gloves, sterile drape, hand hygiene and skin antiseptic. A timeout was performed prior to the initiation of the procedure. Previous imaging reviewed. Patient positioned supine. Noncontrast localization CT performed. The right lower quadrant abscess was localized. Overlying skin marked for a anterior oblique approach just below the umbilicus. Under sterile conditions and local anesthesia, an 18 gauge 10 cm access needle was advanced percutaneously an anterior oblique approach into the abscess. Needle position confirmed with CT. Syringe aspiration yielded purulent fecal contaminated fluid. Guidewire advanced followed by tract dilatation to insert a 10 [place] drain. Drain catheter position confirmed in the abscess. Syringe aspiration yielded 40 cc purulent fecal contaminated fluid. Drain catheter secured with Prolene suture and connected to external suction bulb. Sterile dressing applied. No immediate complication. Patient tolerated the procedure well. IMPRESSION: Successful CT-guided right lower quadrant abscess drain placement Electronically Signed   By: Jerilynn Mages.  Shick M.D.   On: 04/12/2018 15:56    Anti-infectives: Anti-infectives (From admission, onward)   Start     Dose/Rate Route Frequency Ordered Stop   04/12/18 2200  piperacillin-tazobactam (ZOSYN) IVPB 3.375 g     3.375 g 12.5 mL/hr over 240 Minutes Intravenous Every 8 hours 04/12/18 1425     04/12/18 1300  piperacillin-tazobactam (ZOSYN) IVPB 3.375 g     3.375 g 100 mL/hr over 30 Minutes Intravenous  Once 04/12/18 1259 04/12/18 1433      Assessment/Plan:  Acute appendicitis and periappendiceal  abscess.  Clinically improving following IR abscess drain placement yesterday. -Advance diet as tolerated -Ambulate in hall -IV  Zosyn -Labs tomorrow  DVT prophylaxis -On Lovenox  Small pulmonary nodule right lower lobe.  Follow-up as outpatient Hiatal hernia    LOS: 1 day    Adin Hector 04/13/2018

## 2018-04-14 LAB — CBC
HCT: 35.5 % — ABNORMAL LOW (ref 39.0–52.0)
Hemoglobin: 12.1 g/dL — ABNORMAL LOW (ref 13.0–17.0)
MCH: 32.4 pg (ref 26.0–34.0)
MCHC: 34.1 g/dL (ref 30.0–36.0)
MCV: 94.9 fL (ref 80.0–100.0)
Platelets: 327 10*3/uL (ref 150–400)
RBC: 3.74 MIL/uL — ABNORMAL LOW (ref 4.22–5.81)
RDW: 11.8 % (ref 11.5–15.5)
WBC: 6.3 10*3/uL (ref 4.0–10.5)
nRBC: 0 % (ref 0.0–0.2)

## 2018-04-14 LAB — BASIC METABOLIC PANEL
Anion gap: 9 (ref 5–15)
BUN: 11 mg/dL (ref 6–20)
CO2: 26 mmol/L (ref 22–32)
Calcium: 8.3 mg/dL — ABNORMAL LOW (ref 8.9–10.3)
Chloride: 103 mmol/L (ref 98–111)
Creatinine, Ser: 0.97 mg/dL (ref 0.61–1.24)
GFR calc non Af Amer: >60 mL/min (ref >60)
Glucose, Bld: 113 mg/dL — ABNORMAL HIGH (ref 70–99)
Potassium: 4.6 mmol/L (ref 3.5–5.1)
Sodium: 138 mmol/L (ref 135–145)

## 2018-04-14 NOTE — Plan of Care (Signed)
  Problem: Health Behavior/Discharge Planning: Goal: Ability to manage health-related needs will improve Outcome: Progressing   Problem: Clinical Measurements: Goal: Respiratory complications will improve Outcome: Progressing   Problem: Clinical Measurements: Goal: Will remain free from infection Outcome: Progressing   Problem: Activity: Goal: Risk for activity intolerance will decrease Outcome: Progressing   Problem: Nutrition: Goal: Adequate nutrition will be maintained Outcome: Progressing

## 2018-04-14 NOTE — Progress Notes (Signed)
Subjective: Continues to feel better.  Still has pain.  Ambulating in halls.  Voiding without difficulty.  Advancing diet. Afebrile.  Heart rate 68.  SPO2 100%. WBC has normalized to 6.3.  Hemoglobin stable at 12.1.  Potassium 4.6.  Creatinine 0.97.  Drain is down to 30 cc last 24 hours  Cultures polymicrobial including E. coli.  Sensitivities pending  Objective: Vital signs in last 24 hours: Temp:  [98.6 F (37 C)-99.2 F (37.3 C)] 99 F (37.2 C) (12/26 0515) Pulse Rate:  [68-73] 68 (12/26 0515) Resp:  [16-18] 16 (12/26 0515) BP: (114-122)/(73-79) 119/73 (12/26 0515) SpO2:  [99 %-100 %] 100 % (12/26 0515) Last BM Date: 04/13/18  Intake/Output from previous day: 12/25 0701 - 12/26 0700 In: 2335 [P.O.:1180; I.V.:1100; IV Piggyback:50] Out: 480 [Urine:450; Drains:30] Intake/Output this shift: Total I/O In: 225 [P.O.:220; Other:5] Out: 10 [Drains:10]  PE:  General appearance: Alert.  Minimal distress.  Mental status normal.  Wife present in room Resp: clear to auscultation bilaterally GI: Abdomen soft.  Nondistended.  Minimal tenderness.  Drain lower suprapubic area with bloody purulent fluid in bulb.  Looks a little thinner today. Extremities: extremities normal, atraumatic, no cyanosis or edema   Lab Results:  Recent Labs    04/13/18 0432 04/14/18 0216  WBC 9.4 6.3  HGB 12.7* 12.1*  HCT 37.1* 35.5*  PLT 335 327   BMET Recent Labs    04/13/18 0432 04/14/18 0216  NA 140 138  K 4.3 4.6  CL 103 103  CO2 25 26  GLUCOSE 121* 113*  BUN 13 11  CREATININE 0.99 0.97  CALCIUM 8.5* 8.3*   PT/INR Recent Labs    04/12/18 1557  LABPROT 13.9  INR 1.08   ABG No results for input(s): PHART, HCO3 in the last 72 hours.  Invalid input(s): PCO2, PO2  Studies/Results: Ct Abdomen Pelvis W Contrast  Result Date: 04/12/2018 CLINICAL DATA:  Right lower quadrant pain for 1 week. Intermittent fever. Abdominal distention. EXAM: CT ABDOMEN AND PELVIS WITH CONTRAST  TECHNIQUE: Multidetector CT imaging of the abdomen and pelvis was performed using the standard protocol following bolus administration of intravenous contrast. CONTRAST:  119mL OMNIPAQUE IOHEXOL 300 MG/ML  SOLN COMPARISON:  None FINDINGS: Lower chest: The semi solid nodule in the right lower lobe measures 9 x 8 x 13 mm. A high-density punctate nodule is present on image 15 of series 5. There is minimal ground-glass attenuation in the medial left lower lobe. Heart size is normal. No significant pleural or pericardial effusion is present. Hepatobiliary: Fatty infiltration of the liver is noted. No discrete lesions are present. The common bile duct and gallbladder normal. Pancreas: Unremarkable. No pancreatic ductal dilatation or surrounding inflammatory changes. Spleen: Normal in size without focal abnormality. Adrenals/Urinary Tract: Adrenal glands are normal bilaterally. Kidneys are unremarkable. There is no stone or mass lesion. Ureters are within normal limits. The urinary bladder is normal. Stomach/Bowel: A moderate-sized hiatal hernia is present. The stomach is otherwise normal. Duodenum is unremarkable. The proximal small bowel is normal. Marked inflammatory changes are present at the level of the appendix. The appendix is thickened, measuring up to 9 mm. A peri appendiceal mixed density collection measures 5.0 x 6.6 x 4.0 cm. There is secondary and inflammatory changes about the terminal ileum as well as the sigmoid colon. The more distal ascending and transverse colon are within normal limits. Descending colon is unremarkable. Secondary inflammatory changes are present about portions of the sigmoid colon adjacent to the abscess. Vascular/Lymphatic: No  significant vascular findings are present. No enlarged abdominal or pelvic lymph nodes. Reproductive: Prostate is unremarkable. Other: No abdominal wall hernia or abnormality. No abdominopelvic ascites. Musculoskeletal: Mild endplate changes are present at  L5-S1. Bony pelvis is intact. The hips are located and within normal limits bilaterally. IMPRESSION: 1. Heterogeneous periappendiceal fluid collection compatible with contained rupture/abscess measuring up to 6.6 cm. 2. Thickening the appendix compatible with acute/subacute appendicitis. 3. Secondary inflammatory changes involving the terminal ileum and sigmoid colon adjacent to the abscess. 4. Moderate-sized hiatal hernia. 5. Semi-solid right lower lobe pulmonary nodule measures 9 x 8 x 13 mm. Follow-up non-contrast CT recommended at 3-6 months to confirm persistence. If unchanged, and solid component remains <6 mm, annual CT is recommended until 5 years of stability has been established. If persistent these nodules should be considered highly suspicious if the solid component of the nodule is 6 mm or greater in size and enlarging. This recommendation follows the consensus statement: Guidelines for Management of Incidental Pulmonary Nodules Detected on CT Images: From the Fleischner Society 2017; Radiology 2017; 284:228-243. These results were called by telephone at the time of interpretation on 04/12/2018 at 1:04 pm to Dr. Lacretia Leigh , who verbally acknowledged these results. Electronically Signed   By: San Morelle M.D.   On: 04/12/2018 13:07   Ct Image Guided Drainage By Percutaneous Catheter  Result Date: 04/12/2018 INDICATION: Perforated appendicitis with right lower quadrant abscess EXAM: CT DRAINAGE RIGHT LOWER QUADRANT ABSCESS MEDICATIONS: The patient is currently admitted to the hospital and receiving intravenous antibiotics. The antibiotics were administered within an appropriate time frame prior to the initiation of the procedure. ANESTHESIA/SEDATION: Fentanyl 100 mcg IV; Versed 2.0 mg IV Moderate Sedation Time:  15 minutes The patient was continuously monitored during the procedure by the interventional radiology nurse under my direct supervision. COMPLICATIONS: None immediate.  PROCEDURE: Informed written consent was obtained from the patient after a thorough discussion of the procedural risks, benefits and alternatives. All questions were addressed. Maximal Sterile Barrier Technique was utilized including caps, mask, sterile gowns, sterile gloves, sterile drape, hand hygiene and skin antiseptic. A timeout was performed prior to the initiation of the procedure. Previous imaging reviewed. Patient positioned supine. Noncontrast localization CT performed. The right lower quadrant abscess was localized. Overlying skin marked for a anterior oblique approach just below the umbilicus. Under sterile conditions and local anesthesia, an 18 gauge 10 cm access needle was advanced percutaneously an anterior oblique approach into the abscess. Needle position confirmed with CT. Syringe aspiration yielded purulent fecal contaminated fluid. Guidewire advanced followed by tract dilatation to insert a 10 [place] drain. Drain catheter position confirmed in the abscess. Syringe aspiration yielded 40 cc purulent fecal contaminated fluid. Drain catheter secured with Prolene suture and connected to external suction bulb. Sterile dressing applied. No immediate complication. Patient tolerated the procedure well. IMPRESSION: Successful CT-guided right lower quadrant abscess drain placement Electronically Signed   By: Jerilynn Mages.  Shick M.D.   On: 04/12/2018 15:56    Anti-infectives: Anti-infectives (From admission, onward)   Start     Dose/Rate Route Frequency Ordered Stop   04/12/18 2200  piperacillin-tazobactam (ZOSYN) IVPB 3.375 g     3.375 g 12.5 mL/hr over 240 Minutes Intravenous Every 8 hours 04/12/18 1425     04/12/18 1300  piperacillin-tazobactam (ZOSYN) IVPB 3.375 g     3.375 g 100 mL/hr over 30 Minutes Intravenous  Once 04/12/18 1259 04/12/18 1433      Assessment/Plan:  Acute appendicitis and periappendiceal abscess.  Clinically improving following IR abscess drain placement 12/24. -Advance diet as  tolerated -Ambulate in hall -IV Zosyn -Check cultures tomorrow -I explained short-term and long-term algorithms for care including interval appendectomy -Hopefully home later this week or weekend on oral antibiotics for 14 days  DVT prophylaxis -On Lovenox  Small pulmonary nodule right lower lobe.  Follow-up as outpatient Hiatal hernia    LOS: 2 days    Adin Hector 04/14/2018

## 2018-04-14 NOTE — Progress Notes (Signed)
Nutrition Brief Note  Patient identified on the Malnutrition Screening Tool (MST) Report  Wt Readings from Last 15 Encounters:  04/12/18 71.8 kg  01/18/14 74.4 kg  08/25/13 76.2 kg  06/12/13 75.3 kg   Patient is a 62 year old male who presents with a week of intermittent right lower abdominal pain.  He has had some anorexia and constipation.  He has been treating it with NSAIDs.  He was seen in urgent care today because of ongoing symptoms and sent for possible acute appendicitis.  Pt admitted with perforate appendicitis with abscess.   12/24- s/p abscess drain placement  Spoke with pt at bedside, who was in good spirits at time of visit. He reports he usually has a very hearty appetite, consuming 2-3 meals per day. Over the past week, he had been experiencing constipation and abdominal pressure, as well as decreased appetite, however, would continue to eat, as this usually helped him feel better. He is very active at baseline; works for a concrete business.   Pt estimates a small amount of weight loss, however, reports UBW is around 160#.   Observed lunch tray- pt consumed 100% of lunch other than broccoli.   Nutrition-Focused physical exam completed. Findings are no fat depletion, no muscle depletion, and no edema.   Body mass index is 24.07 kg/m. Patient meets criteria for normal weight range based on current BMI.   Current diet order is regular, patient is consuming approximately 50-75% of meals at this time. Labs and medications reviewed.   No nutrition interventions warranted at this time. If nutrition issues arise, please consult RD.   Bevan Vu A. Jimmye Norman, RD, LDN, CDE Pager: [phone] After hours Pager: [phone]

## 2018-04-15 MED ORDER — SALINE FLUSH 0.9 % IV SOLN: 5.0000 mL | Freq: Every day | INTRAVENOUS | 0 refills | Status: DC

## 2018-04-15 MED ORDER — CALCIUM CARBONATE ANTACID 500 MG PO CHEW: 1.0000 | CHEWABLE_TABLET | ORAL | Status: AC | PRN

## 2018-04-15 MED ORDER — TRAMADOL HCL 50 MG PO TABS: 50.0000 mg | ORAL_TABLET | Freq: Four times a day (QID) | ORAL | 0 refills | Status: DC | PRN

## 2018-04-15 MED ORDER — AMOXICILLIN-POT CLAVULANATE 875-125 MG PO TABS: 1.0000 | ORAL_TABLET | Freq: Two times a day (BID) | ORAL | 0 refills | Status: AC

## 2018-04-15 MED ORDER — AMOXICILLIN-POT CLAVULANATE 875-125 MG PO TABS
1.0000 | ORAL_TABLET | Freq: Two times a day (BID) | ORAL | Status: DC
  Administered 2018-04-15: 1 via ORAL
  Filled 2018-04-15: qty 1

## 2018-04-15 MED FILL — AMOX-CLAV 875-125 MG TABLET: 875-125 | 11 days supply | Qty: 22 | Fill #0

## 2018-04-15 MED FILL — traMADol HCL 50 MG TABS: 50 | 4 days supply | Qty: 15 | Fill #0

## 2018-04-15 NOTE — Discharge Instructions (Signed)
Appendicitis, Adult  Appendicitis is inflammation of the appendix. The appendix is a finger-shaped tube that is attached to the large intestine. If appendicitis is not treated, it can cause the appendix to tear (rupture). A ruptured appendix can lead to a life-threatening infection. It can also cause a painful collection of pus (abscess) to form in the appendix. What are the causes? This condition may be caused by a blockage in the appendix that leads to infection. The blockage can be caused by:  A ball of stool (feces).  Enlarged lymph glands. In some cases, the cause may not be known. What increases the risk? Age is a risk factor. You are more likely to develop this condition if you are between 43 and 59 years of age. What are the signs or symptoms? Symptoms of this condition include:  Pain that starts around the belly button and moves toward the lower right part of the abdomen. The pain can become more severe as time passes. It gets worse with coughing or sudden movements.  Tenderness in the lower right abdomen.  Nausea.  Vomiting.  Loss of appetite.  Fever.  Difficulty passing stool (constipation).  Passing very loose stools (diarrhea).  Generally feeling unwell. How is this diagnosed? This condition may be diagnosed with:  A physical exam.  Blood tests.  Urine test. To confirm the diagnosis, an ultrasound, MRI, or CT scan may be done. How is this treated? This condition is usually treated with surgery to remove the appendix (appendectomy). There are two methods for doing an appendectomy:  Open appendectomy. In this surgery, the appendix is removed through a large incision that is made in the lower right abdomen. This procedure may be recommended if: ? You have major scarring from a previous surgery. ? You have a bleeding disorder. ? You are pregnant and are about to give birth. ? You have a condition that makes it hard to do surgery through small incisions  (laparoscopic procedure). This includes severe infection or a ruptured appendix.  Laparoscopic appendectomy. In this surgery, the appendix is removed through small incisions. This procedure usually causes less pain and fewer problems than an open appendectomy. It also has a shorter recovery time. If the appendix has ruptured and an abscess has formed:  A drain may be placed into the abscess to remove fluid.  Antibiotic medicines may be given through an IV.  The appendix may or may not need to be removed. Follow these instructions at home: If you had surgery, follow instructions from your health care provider about how to care for yourself at home and how to care for your incision. Medicines  Take over-the-counter and prescription medicines only as told by your health care provider.  If you were prescribed an antibiotic medicine, take it as told by your health care provider. Do not stop taking the antibiotic even if you start to feel better. Eating and drinking  Follow instructions from your health care provider about eating restrictions. You may slowly resume a regular diet once your nausea or vomiting stops. General instructions  Do not use any products that contain nicotine or tobacco, such as cigarettes, e-cigarettes, and chewing tobacco. If you need help quitting, ask your health care provider.  Do not drive or use heavy machinery while taking prescription pain medicine.  Ask your health care provider if the medicine prescribed to you can cause constipation. You may need to take steps to prevent or treat constipation, such as: ? Drink enough fluid to keep your  urine pale yellow. ? Take over-the-counter or prescription medicines. ? Eat foods that are high in fiber, such as beans, whole grains, and fresh fruits and vegetables. ? Limit foods that are high in fat and processed sugars, such as fried or sweet foods.  Keep all follow-up visits as told by your health care provider. This  is important. Contact a health care provider if:  There is pus, blood, or excessive drainage coming from your incision.  You have nausea or vomiting. Get help right away if you have:  Worsening abdominal pain.  A fever.  Chills.  Fatigue.  Muscle aches.  Shortness of breath. Summary  Appendicitis is inflammation of the appendix.  This condition may be caused by a blockage in the appendix that leads to infection.  This condition is usually treated with surgery to remove the appendix. This information is not intended to replace advice given to you by your health care provider. Make sure you discuss any questions you have with your health care provider. Document Released: 04/06/2005 Document Revised: 09/22/2017 Document Reviewed: 09/22/2017 Elsevier Interactive Patient Education  2019 [place].   Flush drain with 5cc sterile saline daily. Record output daily. Replace guaze and tape after showering. Do no submerge drain site or drain in water.   1. PAIN CONTROL:  1. Pain is best controlled by a usual combination of three different methods TOGETHER:  1. Ice/Heat 2. Over the counter pain medication 3. Prescription pain medication 2. Most patients will experience some swelling and bruising around wounds. Ice packs or heating pads (30-60 minutes up to 6 times a day) will help. Use ice for the first few days to help decrease swelling and bruising, then switch to heat to help relax tight/sore spots and speed recovery. Some people prefer to use ice alone, heat alone, alternating between ice & heat. Experiment to what works for you. Swelling and bruising can take several weeks to resolve.  3. It is helpful to take an over-the-counter pain medication regularly for the first few weeks. Choose one of the following that works best for you:  1. Naproxen (Aleve, etc) Two 220mg  tabs twice a day 2. Ibuprofen (Advil, etc) Three 200mg  tabs four times a day (every meal &  bedtime) 3. Acetaminophen (Tylenol, etc) 500-650mg  four times a day (every meal & bedtime) 4. A prescription for pain medication (such as oxycodone, hydrocodone, etc) should be given to you upon discharge. Take your pain medication as prescribed.  1. If you are having problems/concerns with the prescription medicine (does not control pain, nausea, vomiting, rash, itching, etc), please call us [phone] to see if we need to switch you to a different pain medicine that will work better for you and/or control your side effect better. 2. If you need a refill on your pain medication, please contact your pharmacy. They will contact our office to request authorization. Prescriptions will not be filled after 5 pm or on week-ends. 4. Avoid getting constipated. When taking pain medications, it is common to experience some constipation. Increasing fluid intake and taking a fiber supplement (such as Metamucil, Citrucel, FiberCon, MiraLax, etc) 1-2 times a day regularly will usually help prevent this problem from occurring. A mild laxative (prune juice, Milk of Magnesia, MiraLax, etc) should be taken according to package directions if there are no bowel movements after 48 hours.  5. Watch out for diarrhea. If you have many loose bowel movements, simplify your diet to bland foods & liquids for a few days. Stop  any stool softeners and decrease your fiber supplement. Switching to mild anti-diarrheal medications (Kayopectate, Pepto Bismol) can help. If this worsens or does not improve, please call us. 6. Wash / shower every day. You may shower daily and replace your bandges after showering. No bathing or submerging your drain site in water. 7. FOLLOW UP in our office  1. Please call [facility] at [phone] to set up an appointment for a follow-up appointment approximately 2-3 weeks after discharge for wound check  WHEN TO CALL us [phone]:  1. Poor pain control 2. Reactions / problems with new medications  (rash/itching, nausea, etc)  3. Fever over 101.5 F (38.5 C) 4. Increased pain, redness, or drainage from drain site which could be signs of infection 5. Any concerning symptoms  The clinic staff is available to answer your questions during regular business hours (8:30am-5pm). Please dont hesitate to call and ask to speak to one of our nurses for clinical concerns.  If you have a medical emergency, go to the nearest emergency room or call 911.  A surgeon from [facility] is always on call at the [facility], [place], [place], [place], [place] [address] ?  MAIN: [phone] ? TOLL FREE: [phone] ?  FAX [phone]  [email or web address]   FOLLOW UP WITH YOUR PRIMARY CARE PROVIDER regarding the incidental pulmonary nodule seen on CT scan. This may need further workup to be decided by your doctor.

## 2018-04-15 NOTE — Discharge Summary (Signed)
[facility] Discharge Summary   Patient ID: Ian Walsh MRN: [id number] DOB/AGE: Aug 28, 1955 62 y.o.  Admit date: 04/12/2018 Discharge date: 04/15/2018  Admitting Diagnosis: Perforated appendix with abscess  Discharge Diagnosis Patient Active Problem List   Diagnosis Date Noted  . Perforated appendix 04/12/2018  . Onychomycosis 06/12/2013    Consultants Interventional radioloty  Imaging: No results found.  Procedures Dr. Annamaria Boots (04/12/18) - CT guided drain placement  HPI: Patient is a 62 year old male who presents with a week of intermittent right lower abdominal pain.  He has had some anorexia and constipation.  He has been treating it with NSAIDs.  He was seen in urgent care today because of ongoing symptoms and sent for possible acute appendicitis.  Work-up shows he is afebrile, vital signs are stable.  Labs shows a normal BMP.  WBC is 14.5, hemoglobin 15, hematocrit 45.2 platelets are 378,000.  CT of the abdomen shows a semisolid nodule in the right lower lobe measuring 9 x 8 x 13 mm.  He has a moderate sized hiatal hernia is inflammatory changes at the level of the appendix the appendix is thickened measuring up to 9 mm.  A periappendiceal mixed density collection measuring 5 x 6.6 x 4.0 cm, with secondary inflammatory changes about the terminal ileum as well as the sigmoid colon.  The distal ascending and transverse colons appear within normal limits descending colons unremarkable.  Consistent with a contained rupture/abscess of the appendix.  Hospital Course:  Workup showed perforated appendicitis with abscess. Patient was admitted and underwent procedure listed above. Incidental finding of R pulmonary nodule was seen on CT and discussed with the pt. I instructed the pt to f/u with his PCP. Per the radiology note: Follow-up non-contrast CT recommended at 3-6 months to confirm persistence. If unchanged, and solid component remains <6 mm, annual CT is  recommended until 5 years of stability has been established.   Diet was advanced as tolerated.  On 12/27, the patient was voiding well, tolerating diet, ambulating well, pain well controlled, vital signs stable, drain with serous drainage and felt stable for discharge home. Patient was discharged with drain and instructions on drain care. Patient will follow up as outlined below and knows to call with questions or concerns.     Patient was discharged in good condition.  The [place] Substance controlled database was reviewed prior to prescribing narcotic pain medication to this patient.  Physical Exam: General:  Alert, NAD, pleasant, cooperative Cardio: RRR, S1 & S2 normal, no murmur, rubs, gallops Resp: Effort normal, lungs CTA bilaterally, no wheezes, rales, rhonchi Abd:  Soft, ND, normal bowel sounds, no tenderness,drain with minimal serous nonpurulent drainage, mild TTP at drain site, no guarding, no peritonitis  Skin: warm and dry, no rashes noted  Allergies as of 04/15/2018      Reactions   Codeine Nausea Only      Medication List    STOP taking these medications   Efinaconazole 10 % Soln Commonly known as:  JUBLIA   terbinafine 250 MG tablet Commonly known as:  LAMISIL     TAKE these medications   acetaminophen 500 MG tablet Commonly known as:  TYLENOL Take 1,000 mg by mouth every 6 (six) hours as needed for fever.   amoxicillin-clavulanate 875-125 MG tablet Commonly known as:  AUGMENTIN Take 1 tablet by mouth every 12 (twelve) hours for 11 days.   aspirin 81 MG tablet Take 81 mg by mouth daily.   calcium carbonate 500 MG chewable  tablet Commonly known as:  TUMS - dosed in mg elemental calcium Chew 1 tablet (200 mg of elemental calcium total) by mouth as needed for indigestion or heartburn. What changed:  how much to take   naproxen 500 MG tablet Commonly known as:  NAPROSYN Take 250 mg by mouth as needed for mild pain or moderate pain.   omeprazole 20  MG capsule Commonly known as:  PRILOSEC Take 20 mg by mouth as needed (reflux).   polyethylene glycol packet Commonly known as:  MIRALAX / GLYCOLAX Take 17 g by mouth daily as needed for mild constipation or moderate constipation.   Saline Flush 0.9 % Soln Give 5 mLs by tube daily.   traMADol 50 MG tablet Commonly known as:  ULTRAM Take 1 tablet (50 mg total) by mouth every 6 (six) hours as needed for moderate pain.        Follow-up Information    Greggory Keen, MD. Call.   Specialties:  Interventional Radiology, Radiology Why:  to see when your follow up drain appointment is Contact information: [address] [place] [address] [phone]        Clovis Riley, MD. Go on 05/06/2018.   Specialty:  General Surgery Why:  at 10:30am. Please arrive 20 minutes prior to complete paperwork. Please bring photo ID and insurance card Contact information: [address] [place] [address] [phone]           Signed: [facility] 04/15/2018, 9:00 AM Pager: [phone] Consults: [phone] Mon-Fri 7:00 am-4:30 pm Sat-Sun 7:00 am-11:30 am

## 2018-04-15 NOTE — Progress Notes (Signed)
Referring Physician(s):  Dr Leane Para  Supervising Physician: Markus Daft  Patient Status:  [facility] - In-pt  Chief Complaint:  RLQ abscess Drain placed 12/24  Subjective:  Doing well Denies pain or fever or chills DC to home now per [facility]   Allergies: Codeine  Medications: Prior to Admission medications   Medication Sig Start Date End Date Taking? Authorizing Provider  acetaminophen (TYLENOL) 500 MG tablet Take 1,000 mg by mouth every 6 (six) hours as needed for fever.   Yes [provider]  aspirin 81 MG tablet Take 81 mg by mouth daily.   Yes [provider]  naproxen (NAPROSYN) 500 MG tablet Take 250 mg by mouth as needed for mild pain or moderate pain.   Yes [provider]  omeprazole (PRILOSEC) 20 MG capsule Take 20 mg by mouth as needed (reflux).    Yes [provider]  polyethylene glycol (MIRALAX / GLYCOLAX) packet Take 17 g by mouth daily as needed for mild constipation or moderate constipation.   Yes [provider]  amoxicillin-clavulanate (AUGMENTIN) 875-125 MG tablet Take 1 tablet by mouth every 12 (twelve) hours for 11 days. 04/15/18 04/26/18  Focht, Fraser Din, PA  calcium carbonate (TUMS - DOSED IN MG ELEMENTAL CALCIUM) 500 MG chewable tablet Chew 1 tablet (200 mg of elemental calcium total) by mouth as needed for indigestion or heartburn. 04/15/18   Focht, Fraser Din, PA  Efinaconazole (JUBLIA) 10 % SOLN Apply 1 applicator topically every morning. Patient not taking: Reported on 04/12/2018 12/08/13   Sheard, Myeong O, DPM  Sodium Chloride Flush (SALINE FLUSH) 0.9 % SOLN Give 5 mLs by tube daily. 04/15/18   Focht, Fraser Din, PA  terbinafine (LAMISIL) 250 MG tablet Take 1 tablet (250 mg total) by mouth daily. Patient not taking: Reported on 04/12/2018 06/12/13   Sheard, Myeong O, DPM  traMADol (ULTRAM) 50 MG tablet Take 1 tablet (50 mg total) by mouth every 6 (six) hours as needed for moderate pain. 04/15/18   Focht, Fraser Din,  PA     Vital Signs: BP 118/76 (BP Location: Left Arm)   Pulse 65   Temp 98.4 F (36.9 C) (Oral)   Resp 18   Ht 5\' 8"  (1.727 m)   Wt 158 lb 5 oz (71.8 kg)   SpO2 99%   BMI 24.07 kg/m   Physical Exam Vitals signs reviewed.  Abdominal:     Palpations: Abdomen is soft.  Skin:    General: Skin is warm and dry.     Comments: Drain site is clean and dry NT No bleeding OP serous color      Imaging: Ct Abdomen Pelvis W Contrast  Result Date: 04/12/2018 CLINICAL DATA:  Right lower quadrant pain for 1 week. Intermittent fever. Abdominal distention. EXAM: CT ABDOMEN AND PELVIS WITH CONTRAST TECHNIQUE: Multidetector CT imaging of the abdomen and pelvis was performed using the standard protocol following bolus administration of intravenous contrast. CONTRAST:  163mL OMNIPAQUE IOHEXOL 300 MG/ML  SOLN COMPARISON:  None FINDINGS: Lower chest: The semi solid nodule in the right lower lobe measures 9 x 8 x 13 mm. A high-density punctate nodule is present on image 15 of series 5. There is minimal ground-glass attenuation in the medial left lower lobe. Heart size is normal. No significant pleural or pericardial effusion is present. Hepatobiliary: Fatty infiltration of the liver is noted. No discrete lesions are present. The common bile duct and gallbladder normal. Pancreas: Unremarkable. No pancreatic ductal dilatation or surrounding  inflammatory changes. Spleen: Normal in size without focal abnormality. Adrenals/Urinary Tract: Adrenal glands are normal bilaterally. Kidneys are unremarkable. There is no stone or mass lesion. Ureters are within normal limits. The urinary bladder is normal. Stomach/Bowel: A moderate-sized hiatal hernia is present. The stomach is otherwise normal. Duodenum is unremarkable. The proximal small bowel is normal. Marked inflammatory changes are present at the level of the appendix. The appendix is thickened, measuring up to 9 mm. A peri appendiceal mixed density collection  measures 5.0 x 6.6 x 4.0 cm. There is secondary and inflammatory changes about the terminal ileum as well as the sigmoid colon. The more distal ascending and transverse colon are within normal limits. Descending colon is unremarkable. Secondary inflammatory changes are present about portions of the sigmoid colon adjacent to the abscess. Vascular/Lymphatic: No significant vascular findings are present. No enlarged abdominal or pelvic lymph nodes. Reproductive: Prostate is unremarkable. Other: No abdominal wall hernia or abnormality. No abdominopelvic ascites. Musculoskeletal: Mild endplate changes are present at L5-S1. Bony pelvis is intact. The hips are located and within normal limits bilaterally. IMPRESSION: 1. Heterogeneous periappendiceal fluid collection compatible with contained rupture/abscess measuring up to 6.6 cm. 2. Thickening the appendix compatible with acute/subacute appendicitis. 3. Secondary inflammatory changes involving the terminal ileum and sigmoid colon adjacent to the abscess. 4. Moderate-sized hiatal hernia. 5. Semi-solid right lower lobe pulmonary nodule measures 9 x 8 x 13 mm. Follow-up non-contrast CT recommended at 3-6 months to confirm persistence. If unchanged, and solid component remains <6 mm, annual CT is recommended until 5 years of stability has been established. If persistent these nodules should be considered highly suspicious if the solid component of the nodule is 6 mm or greater in size and enlarging. This recommendation follows the consensus statement: Guidelines for Management of Incidental Pulmonary Nodules Detected on CT Images: From the Fleischner Society 2017; Radiology 2017; 284:228-243. These results were called by telephone at the time of interpretation on 04/12/2018 at 1:04 pm to Dr. Lacretia Leigh , who verbally acknowledged these results. Electronically Signed   By: San Morelle M.D.   On: 04/12/2018 13:07   Ct Image Guided Drainage By Percutaneous  Catheter  Result Date: 04/12/2018 INDICATION: Perforated appendicitis with right lower quadrant abscess EXAM: CT DRAINAGE RIGHT LOWER QUADRANT ABSCESS MEDICATIONS: The patient is currently admitted to the hospital and receiving intravenous antibiotics. The antibiotics were administered within an appropriate time frame prior to the initiation of the procedure. ANESTHESIA/SEDATION: Fentanyl 100 mcg IV; Versed 2.0 mg IV Moderate Sedation Time:  15 minutes The patient was continuously monitored during the procedure by the interventional radiology nurse under my direct supervision. COMPLICATIONS: None immediate. PROCEDURE: Informed written consent was obtained from the patient after a thorough discussion of the procedural risks, benefits and alternatives. All questions were addressed. Maximal Sterile Barrier Technique was utilized including caps, mask, sterile gowns, sterile gloves, sterile drape, hand hygiene and skin antiseptic. A timeout was performed prior to the initiation of the procedure. Previous imaging reviewed. Patient positioned supine. Noncontrast localization CT performed. The right lower quadrant abscess was localized. Overlying skin marked for a anterior oblique approach just below the umbilicus. Under sterile conditions and local anesthesia, an 18 gauge 10 cm access needle was advanced percutaneously an anterior oblique approach into the abscess. Needle position confirmed with CT. Syringe aspiration yielded purulent fecal contaminated fluid. Guidewire advanced followed by tract dilatation to insert a 10 [place] drain. Drain catheter position confirmed in the abscess. Syringe aspiration yielded 40 cc purulent fecal  contaminated fluid. Drain catheter secured with Prolene suture and connected to external suction bulb. Sterile dressing applied. No immediate complication. Patient tolerated the procedure well. IMPRESSION: Successful CT-guided right lower quadrant abscess drain placement Electronically Signed    By: Jerilynn Mages.  Shick M.D.   On: 04/12/2018 15:56    Labs:  CBC: Recent Labs    04/12/18 1138 04/12/18 1557 04/13/18 0432 04/14/18 0216  WBC 14.5* 13.7* 9.4 6.3  HGB 15.0 14.2 12.7* 12.1*  HCT 45.2 41.0 37.1* 35.5*  PLT 378 352 335 327    COAGS: Recent Labs    04/12/18 1557  INR 1.08    BMP: Recent Labs    04/12/18 1138 04/12/18 1557 04/13/18 0432 04/14/18 0216  NA 139  --  140 138  K 4.1  --  4.3 4.6  CL 99  --  103 103  CO2 26  --  25 26  GLUCOSE 111*  --  121* 113*  BUN 15  --  13 11  CALCIUM 9.4  --  8.5* 8.3*  CREATININE 1.10 1.08 0.99 0.97  GFRNONAA >60 >60 >60 >60  GFRAA >60 >60 >60 >60    LIVER FUNCTION TESTS: Recent Labs    04/13/18 0432  BILITOT 0.8  AST 10*  ALT 24  ALKPHOS 122  PROT 6.2*  ALBUMIN 2.4*    Assessment and Plan:  RLQ abscess drain intact Follow in IR OP Clinic 7-10 days He will hear from scheduler for time and date Needs to flush drain daily 5-10 cc sterile saline Record OP OP orders in place  Electronically Signed: Lavonia Drafts, PA-C 04/15/2018, 10:04 AM   I spent a total of 15 Minutes at the the patient's bedside AND on the patient's hospital floor or unit, greater than 50% of which was counseling/coordinating care for RLQ abs drain

## 2018-04-15 NOTE — Progress Notes (Signed)
Patient discharged to home with instructions. 

## 2018-04-17 LAB — AEROBIC/ANAEROBIC CULTURE W GRAM STAIN (SURGICAL/DEEP WOUND): Special Requests: NORMAL

## 2018-04-18 ENCOUNTER — Other Ambulatory Visit: Payer: Self-pay | Admitting: Surgery

## 2018-04-18 DIAGNOSIS — K3532 Acute appendicitis with perforation and localized peritonitis, without abscess: Secondary | ICD-10-CM

## 2018-04-27 ENCOUNTER — Encounter: Payer: Self-pay | Admitting: Radiology

## 2018-04-28 ENCOUNTER — Other Ambulatory Visit: Payer: 59

## 2018-04-29 ENCOUNTER — Ambulatory Visit (HOSPITAL_COMMUNITY)
Admission: RE | Admit: 2018-04-29 | Discharge: 2018-04-29 | Disposition: A | Discharge status: Home / Self Care | Payer: 59 | Source: Ambulatory Visit | Attending: Surgery | Admitting: Surgery

## 2018-04-29 ENCOUNTER — Encounter (HOSPITAL_COMMUNITY): Payer: Self-pay | Admitting: Interventional Radiology

## 2018-04-29 DIAGNOSIS — K449 Diaphragmatic hernia without obstruction or gangrene: Secondary | ICD-10-CM | POA: Insufficient documentation

## 2018-04-29 DIAGNOSIS — K3532 Acute appendicitis with perforation and localized peritonitis, without abscess: Secondary | ICD-10-CM

## 2018-04-29 DIAGNOSIS — K3521 Acute appendicitis with generalized peritonitis, with abscess: Secondary | ICD-10-CM | POA: Diagnosis not present

## 2018-04-29 DIAGNOSIS — Z4682 Encounter for fitting and adjustment of non-vascular catheter: Secondary | ICD-10-CM | POA: Insufficient documentation

## 2018-04-29 DIAGNOSIS — R933 Abnormal findings on diagnostic imaging of other parts of digestive tract: Secondary | ICD-10-CM | POA: Diagnosis not present

## 2018-04-29 DIAGNOSIS — Z4803 Encounter for change or removal of drains: Secondary | ICD-10-CM | POA: Diagnosis not present

## 2018-04-29 HISTORY — PX: IR SINUS/FIST TUBE CHK-NON GI: IMG673

## 2018-04-29 MED ORDER — IOHEXOL 300 MG/ML  SOLN
100.0000 mL | Freq: Once | INTRAMUSCULAR | Status: AC | PRN
  Administered 2018-04-29: 100 mL via INTRAVENOUS

## 2018-04-29 MED ORDER — IOPAMIDOL (ISOVUE-300) INJECTION 61%
INTRAVENOUS | Status: AC
  Administered 2018-04-29: 4 mL
  Filled 2018-04-29: qty 50

## 2018-05-06 ENCOUNTER — Ambulatory Visit: Payer: Self-pay | Admitting: Surgery

## 2018-05-06 DIAGNOSIS — K3532 Acute appendicitis with perforation and localized peritonitis, without abscess: Secondary | ICD-10-CM | POA: Diagnosis not present

## 2018-05-06 NOTE — H&P (Signed)
Surgical H&P  CC: history of perforated appendicitis  HPI: Very nice gentleman who was admitted 12/24-27/19 with perforated appendicitis treated with a drain, which was removed on January 10.  He denies any abdominal pain or fevers since that time.  Reports normal bowel movements.  He has no prior history of abdominal surgery.he presents today to discuss interval appendectomy.  Allergies  Allergen Reactions  . Codeine Nausea Only    Past Medical History:  Diagnosis Date  . GERD (gastroesophageal reflux disease)   . Pneumonia 2010's X 1   "walking pneumonia"    Past Surgical History:  Procedure Laterality Date  . IR SINUS/FIST TUBE CHK-NON GI  04/29/2018  . NO PAST SURGERIES      No family history on file.  Social History   Socioeconomic History  . Marital status: Married    Spouse name: Not on file  . Number of children: Not on file  . Years of education: Not on file  . Highest education level: Not on file  Occupational History  . Not on file  Social Needs  . Financial resource strain: Not on file  . Food insecurity:    Worry: Not on file    Inability: Not on file  . Transportation needs:    Medical: Not on file    Non-medical: Not on file  Tobacco Use  . Smoking status: Never Smoker  . Smokeless tobacco: Never Used  Substance and Sexual Activity  . Alcohol use: Yes    Alcohol/week: 2.0 standard drinks    Types: 2 Standard drinks or equivalent per week  . Drug use: Never  . Sexual activity: Yes  Lifestyle  . Physical activity:    Days per week: Not on file    Minutes per session: Not on file  . Stress: Not on file  Relationships  . Social connections:    Talks on phone: Not on file    Gets together: Not on file    Attends religious service: Not on file    Active member of club or organization: Not on file    Attends meetings of clubs or organizations: Not on file    Relationship status: Not on file  Other Topics Concern  . Not on file  Social History  Narrative  . Not on file    Current Outpatient Medications on File Prior to Visit  Medication Sig Dispense Refill  . acetaminophen (TYLENOL) 500 MG tablet Take 1,000 mg by mouth every 6 (six) hours as needed for fever.    Marland Kitchen aspirin 81 MG tablet Take 81 mg by mouth daily.    . calcium carbonate (TUMS - DOSED IN MG ELEMENTAL CALCIUM) 500 MG chewable tablet Chew 1 tablet (200 mg of elemental calcium total) by mouth as needed for indigestion or heartburn.    . naproxen (NAPROSYN) 500 MG tablet Take 250 mg by mouth as needed for mild pain or moderate pain.    Marland Kitchen omeprazole (PRILOSEC) 20 MG capsule Take 20 mg by mouth as needed (reflux).     . polyethylene glycol (MIRALAX / GLYCOLAX) packet Take 17 g by mouth daily as needed for mild constipation or moderate constipation.    . Sodium Chloride Flush (SALINE FLUSH) 0.9 % SOLN Give 5 mLs by tube daily. 20 Syringe 0  . traMADol (ULTRAM) 50 MG tablet Take 1 tablet (50 mg total) by mouth every 6 (six) hours as needed for moderate pain. 15 tablet 0   No current facility-administered medications on file  prior to visit.     Review of Systems: a complete, 10pt review of systems was completed with pertinent positives and negatives as documented in the HPI  Physical Exam: There were no vitals filed for this visit. Gen: alert and well appearing Eye: extraocular motion intact, no scleral icterus ENT: moist mucus membranes, dentition intact Neck: no mass or thyromegaly Chest: unlabored respirations, symmetrical air entry, clear bilaterally CV: regular rate and rhythm, no pedal edema Abdomen: soft, nontender, nondistended. No mass or organomegaly.  Well-healed drain site just inferior to the left of the umbilicus. MSK: strength symmetrical throughout, no deformity Neuro: grossly intact, normal gait Psych: normal mood and affect, appropriate insight Skin: warm and dry, no rash or lesion on limited exam   CBC Latest Ref Rng & Units 04/14/2018 04/13/2018  04/12/2018  WBC 4.0 - 10.5 K/uL 6.3 9.4 13.7(H)  Hemoglobin 13.0 - 17.0 g/dL 12.1(L) 12.7(L) 14.2  Hematocrit 39.0 - 52.0 % 35.5(L) 37.1(L) 41.0  Platelets 150 - 400 K/uL 327 335 352    CMP Latest Ref Rng & Units 04/14/2018 04/13/2018 04/12/2018  Glucose 70 - 99 mg/dL 113(H) 121(H) -  BUN 6 - 20 mg/dL 11 13 -  Creatinine 0.61 - 1.24 mg/dL 0.97 0.99 1.08  Sodium 135 - 145 mmol/L 138 140 -  Potassium 3.5 - 5.1 mmol/L 4.6 4.3 -  Chloride 98 - 111 mmol/L 103 103 -  CO2 22 - 32 mmol/L 26 25 -  Calcium 8.9 - 10.3 mg/dL 8.3(L) 8.5(L) -  Total Protein 6.5 - 8.1 g/dL - 6.2(L) -  Total Bilirubin 0.3 - 1.2 mg/dL - 0.8 -  Alkaline Phos 38 - 126 U/L - 122 -  AST 15 - 41 U/L - 10(L) -  ALT 0 - 44 U/L - 24 -    Lab Results  Component Value Date   INR 1.08 04/12/2018    Imaging: No results found.    A/P: PERFORATED APPENDICITIS (K35.32) Story: Now resolved. I recommend proceeding with laparoscopic appendectomy 6 weeks or more from the time of drain removal. We discussed the surgery including risks of bleeding, infection, pain, scarring, injury to intra-abdominal structures, conversion to open surgery or more extensive resection, risk of staple line leak or delayed abscess, failure to resolve symptoms, postoperative ileus, incisional hernia, as well as general risks of DVT/PE, pneumonia, stroke, heart attack, death. Questions were welcomed and answered to the patient's satisfaction. Plan surgery around or after February 21.    Romana Juniper, MD [facility], [place] Pager [phone]

## 2018-06-03 DIAGNOSIS — Z13220 Encounter for screening for lipoid disorders: Secondary | ICD-10-CM | POA: Diagnosis not present

## 2018-06-03 DIAGNOSIS — Z0000 Encounter for general adult medical examination without abnormal findings: Secondary | ICD-10-CM | POA: Diagnosis not present

## 2018-06-09 NOTE — Pre-Procedure Instructions (Signed)
[place]  06/09/2018      [id number] [place], [place] [address] Phone: [phone] Fax: [phone]  [place], [place] [address] Phone: [phone] Fax: [phone]  [place] [facility] [place], [place] - [address] [place] [address] Phone: [phone] Fax: [phone]    Your procedure is scheduled on June 20, 2018.  Report to [facility] Admitting at 530 AM.  Call this number if you have problems the morning of surgery:  [phone]   Remember:  Do not eat or drink after midnight.     Take these medicines the morning of surgery with A SIP OF WATER  Tylenol-if needed Famotidine (pepcid)-if needed Omeprazole (prilosec)-if needed  7 days prior to surgery STOP taking any Aspirin (unless otherwise instructed by your surgeon), Aleve, Naproxen, Ibuprofen, Motrin, Advil, Goody's, BC's, all herbal medications, fish oil, and all vitamins   Do not wear jewelry  Do not wear lotions, powders, or colognes, or deodorant.  Men may shave face and neck.  Do not bring valuables to the hospital.  [facility] is not responsible for any belongings or valuables.  Contacts, dentures or bridgework may not be worn into surgery.  Leave your suitcase in the car.  After surgery it may be brought to your room.  For patients admitted to the hospital, discharge time will be determined by your treatment team.  Patients discharged the day of surgery will not be allowed to drive home.    [place]- Preparing For Surgery  Before surgery, you can play an important role. Because skin is not sterile, your skin needs to be as free of germs as possible. You can reduce the number of germs on your skin by washing with CHG (chlorahexidine gluconate) Soap before  surgery.  CHG is an antiseptic cleaner which kills germs and bonds with the skin to continue killing germs even after washing.    Oral Hygiene is also important to reduce your risk of infection.  Remember - BRUSH YOUR TEETH THE MORNING OF SURGERY WITH YOUR REGULAR TOOTHPASTE  Please do not use if you have an allergy to CHG or antibacterial soaps. If your skin becomes reddened/irritated stop using the CHG.  Do not shave (including legs and underarms) for at least 48 hours prior to first CHG shower. It is OK to shave your face.  Please follow these instructions carefully.   1. Shower the NIGHT BEFORE SURGERY and the MORNING OF SURGERY with CHG.   2. If you chose to wash your hair, wash your hair first as usual with your normal shampoo.  3. After you shampoo, rinse your hair and body thoroughly to remove the shampoo.  4. Use CHG as you would any other liquid soap. You can apply CHG directly to the skin and wash gently with a scrungie or a clean washcloth.   5. Apply the CHG Soap to your body ONLY FROM THE NECK DOWN.  Do not use on open wounds or open sores. Avoid contact with your eyes, ears, mouth and genitals (private parts). Wash Face and genitals (private parts)  with your normal soap.  6. Wash thoroughly, paying special attention to the area where your surgery will be performed.  7. Thoroughly rinse your body with warm water from the  neck down.  8. DO NOT shower/wash with your normal soap after using and rinsing off the CHG Soap.  9. Pat yourself dry with a CLEAN TOWEL.  10. Wear CLEAN PAJAMAS to bed the night before surgery, wear comfortable clothes the morning of surgery  11. Place CLEAN SHEETS on your bed the night of your first shower and DO NOT SLEEP WITH PETS.  Day of Surgery:  Do not apply any deodorants/lotions.  Please wear clean clothes to the hospital/surgery center.   Remember to brush your teeth WITH YOUR REGULAR TOOTHPASTE.  Please read over the following fact  sheets that you were given.

## 2018-06-09 NOTE — Progress Notes (Signed)
PCP -  Cardiologist -  Chest x-ray -  EKG -   Stress Test -  ECHO -   Cardiac Cath -   Sleep Study -  CPAP -   Fasting Blood Sugar -  Checks Blood Sugar _____ times a day  Blood Thinner Instructions: Aspirin Instructions:  Anesthesia review:   Patient denies shortness of breath, fever, cough and chest pain at PAT appointment  Patient verbalized understanding of instructions that were given to them at the PAT appointment. Patient was also instructed that they will need to review over the PAT instructions again at home before surgery. 

## 2018-06-10 ENCOUNTER — Inpatient Hospital Stay (HOSPITAL_COMMUNITY)
Admission: RE | Admit: 2018-06-10 | Discharge: 2018-06-10 | Disposition: A | Discharge status: Home / Self Care | Payer: 59 | Source: Ambulatory Visit

## 2018-06-15 ENCOUNTER — Encounter (HOSPITAL_COMMUNITY): Payer: Self-pay

## 2018-06-15 ENCOUNTER — Encounter (HOSPITAL_COMMUNITY)
Admission: RE | Admit: 2018-06-15 | Discharge: 2018-06-15 | Disposition: A | Discharge status: Home / Self Care | Payer: 59 | Source: Ambulatory Visit | Attending: Surgery | Admitting: Surgery

## 2018-06-15 ENCOUNTER — Other Ambulatory Visit: Payer: Self-pay

## 2018-06-15 DIAGNOSIS — Z01812 Encounter for preprocedural laboratory examination: Secondary | ICD-10-CM | POA: Insufficient documentation

## 2018-06-15 LAB — CBC WITH DIFFERENTIAL/PLATELET
Abs Immature Granulocytes: 0.01 10*3/uL (ref 0.00–0.07)
Basophils Absolute: 0 10*3/uL (ref 0.0–0.1)
Basophils Relative: 0 %
Eosinophils Absolute: 0.1 10*3/uL (ref 0.0–0.5)
Eosinophils Relative: 2 %
HEMATOCRIT: 43.9 % (ref 39.0–52.0)
Hemoglobin: 15.2 g/dL (ref 13.0–17.0)
Immature Granulocytes: 0 %
Lymphocytes Relative: 31 %
Lymphs Abs: 1.4 10*3/uL (ref 0.7–4.0)
MCH: 32.2 pg (ref 26.0–34.0)
MCHC: 34.6 g/dL (ref 30.0–36.0)
MCV: 93 fL (ref 80.0–100.0)
MONO ABS: 0.5 10*3/uL (ref 0.1–1.0)
Monocytes Relative: 11 %
Neutro Abs: 2.5 10*3/uL (ref 1.7–7.7)
Neutrophils Relative %: 56 %
Platelets: 263 10*3/uL (ref 150–400)
RBC: 4.72 MIL/uL (ref 4.22–5.81)
RDW: 13.2 % (ref 11.5–15.5)
WBC: 4.5 10*3/uL (ref 4.0–10.5)
nRBC: 0 % (ref 0.0–0.2)

## 2018-06-15 LAB — BASIC METABOLIC PANEL
Anion gap: 10 (ref 5–15)
BUN: 16 mg/dL (ref 6–20)
CO2: 25 mmol/L (ref 22–32)
CREATININE: 0.95 mg/dL (ref 0.61–1.24)
Calcium: 8.8 mg/dL — ABNORMAL LOW (ref 8.9–10.3)
Chloride: 105 mmol/L (ref 98–111)
GFR calc Af Amer: >60 mL/min (ref >60)
GFR calc non Af Amer: >60 mL/min (ref >60)
Glucose, Bld: 100 mg/dL — ABNORMAL HIGH (ref 70–99)
Potassium: 4 mmol/L (ref 3.5–5.1)
Sodium: 140 mmol/L (ref 135–145)

## 2018-06-15 NOTE — Progress Notes (Signed)
PCP - Shirline Frees Cardiologist - denies  Chest x-ray - N/A EKG - N/A  DM: denies SA: denies   Anesthesia review: N/A  Patient denies shortness of breath, fever, cough and chest pain at PAT appointment   Patient verbalized understanding of instructions that were given to them at the PAT appointment. Patient was also instructed that they will need to review over the PAT instructions again at home before surgery.

## 2018-06-20 ENCOUNTER — Encounter (HOSPITAL_COMMUNITY)
Admission: RE | Disposition: A | Discharge status: Home / Self Care | Payer: Self-pay | Source: Home / Self Care | Attending: Surgery

## 2018-06-20 ENCOUNTER — Ambulatory Visit (HOSPITAL_COMMUNITY): Payer: 59 | Admitting: Anesthesiology

## 2018-06-20 ENCOUNTER — Encounter (HOSPITAL_COMMUNITY): Payer: Self-pay

## 2018-06-20 ENCOUNTER — Other Ambulatory Visit: Payer: Self-pay

## 2018-06-20 ENCOUNTER — Ambulatory Visit (HOSPITAL_COMMUNITY)
Admission: RE | Admit: 2018-06-20 | Discharge: 2018-06-20 | Disposition: A | Discharge status: Home / Self Care | Payer: 59 | Attending: Surgery | Admitting: Surgery

## 2018-06-20 DIAGNOSIS — K352 Acute appendicitis with generalized peritonitis, without abscess: Secondary | ICD-10-CM | POA: Diagnosis not present

## 2018-06-20 DIAGNOSIS — K3532 Acute appendicitis with perforation and localized peritonitis, without abscess: Secondary | ICD-10-CM | POA: Insufficient documentation

## 2018-06-20 DIAGNOSIS — K219 Gastro-esophageal reflux disease without esophagitis: Secondary | ICD-10-CM | POA: Insufficient documentation

## 2018-06-20 DIAGNOSIS — Z7982 Long term (current) use of aspirin: Secondary | ICD-10-CM | POA: Diagnosis not present

## 2018-06-20 DIAGNOSIS — D490 Neoplasm of unspecified behavior of digestive system: Secondary | ICD-10-CM | POA: Insufficient documentation

## 2018-06-20 DIAGNOSIS — K382 Diverticulum of appendix: Secondary | ICD-10-CM | POA: Diagnosis not present

## 2018-06-20 DIAGNOSIS — K388 Other specified diseases of appendix: Secondary | ICD-10-CM | POA: Insufficient documentation

## 2018-06-20 DIAGNOSIS — D121 Benign neoplasm of appendix: Secondary | ICD-10-CM | POA: Diagnosis not present

## 2018-06-20 HISTORY — PX: LAPAROSCOPIC APPENDECTOMY: SHX408

## 2018-06-20 SURGERY — APPENDECTOMY, LAPAROSCOPIC
Anesthesia: General | Site: Abdomen

## 2018-06-20 MED ORDER — GABAPENTIN 300 MG PO CAPS
300.0000 mg | ORAL_CAPSULE | ORAL | Status: AC
  Administered 2018-06-20: 300 mg via ORAL
  Filled 2018-06-20: qty 1

## 2018-06-20 MED ORDER — ACETAMINOPHEN 650 MG RE SUPP: 650.0000 mg | RECTAL | Status: DC | PRN

## 2018-06-20 MED ORDER — CHLORHEXIDINE GLUCONATE 4 % EX LIQD: 60.0000 mL | Freq: Once | CUTANEOUS | Status: DC

## 2018-06-20 MED ORDER — ROCURONIUM BROMIDE 10 MG/ML (PF) SYRINGE
PREFILLED_SYRINGE | INTRAVENOUS | Status: DC | PRN
  Administered 2018-06-20: 50 mg via INTRAVENOUS
  Administered 2018-06-20: 20 mg via INTRAVENOUS
  Administered 2018-06-20: 10 mg via INTRAVENOUS

## 2018-06-20 MED ORDER — DEXAMETHASONE SODIUM PHOSPHATE 10 MG/ML IJ SOLN
INTRAMUSCULAR | Status: DC | PRN
  Administered 2018-06-20: 4 mg via INTRAVENOUS

## 2018-06-20 MED ORDER — BUPIVACAINE HCL (PF) 0.25 % IJ SOLN
INTRAMUSCULAR | Status: AC
  Filled 2018-06-20: qty 30

## 2018-06-20 MED ORDER — CELECOXIB 200 MG PO CAPS
200.0000 mg | ORAL_CAPSULE | ORAL | Status: AC
  Administered 2018-06-20: 200 mg via ORAL
  Filled 2018-06-20: qty 1

## 2018-06-20 MED ORDER — SUGAMMADEX SODIUM 200 MG/2ML IV SOLN
INTRAVENOUS | Status: DC | PRN
  Administered 2018-06-20: 200 mg via INTRAVENOUS

## 2018-06-20 MED ORDER — BUPIVACAINE LIPOSOME 1.3 % IJ SUSP
20.0000 mL | Freq: Once | INTRAMUSCULAR | Status: DC
  Filled 2018-06-20 (×2): qty 20

## 2018-06-20 MED ORDER — ACETAMINOPHEN 500 MG PO TABS
1000.0000 mg | ORAL_TABLET | ORAL | Status: AC
  Administered 2018-06-20: 1000 mg via ORAL
  Filled 2018-06-20: qty 2

## 2018-06-20 MED ORDER — LACTATED RINGERS IV SOLN
INTRAVENOUS | Status: DC | PRN
  Administered 2018-06-20: 07:00:00 via INTRAVENOUS

## 2018-06-20 MED ORDER — OXYCODONE HCL 5 MG PO TABS: 5.0000 mg | ORAL_TABLET | ORAL | Status: DC | PRN

## 2018-06-20 MED ORDER — SODIUM CHLORIDE 0.9 % IR SOLN
Status: DC | PRN
  Administered 2018-06-20: 1

## 2018-06-20 MED ORDER — LIDOCAINE 2% (20 MG/ML) 5 ML SYRINGE
INTRAMUSCULAR | Status: DC | PRN
  Administered 2018-06-20 (×2): 40 mg via INTRAVENOUS

## 2018-06-20 MED ORDER — DOCUSATE SODIUM 100 MG PO CAPS: 100.0000 mg | ORAL_CAPSULE | Freq: Two times a day (BID) | ORAL | 0 refills | Status: AC

## 2018-06-20 MED ORDER — SODIUM CHLORIDE 0.9 % IV SOLN: 250.0000 mL | INTRAVENOUS | Status: DC | PRN

## 2018-06-20 MED ORDER — BUPIVACAINE LIPOSOME 1.3 % IJ SUSP
INTRAMUSCULAR | Status: DC | PRN
  Administered 2018-06-20: 20 mL

## 2018-06-20 MED ORDER — FENTANYL CITRATE (PF) 100 MCG/2ML IJ SOLN: 25.0000 ug | INTRAMUSCULAR | Status: DC | PRN

## 2018-06-20 MED ORDER — PROPOFOL 10 MG/ML IV BOLUS
INTRAVENOUS | Status: AC
  Filled 2018-06-20: qty 20

## 2018-06-20 MED ORDER — FENTANYL CITRATE (PF) 250 MCG/5ML IJ SOLN
INTRAMUSCULAR | Status: DC | PRN
  Administered 2018-06-20: 50 ug via INTRAVENOUS
  Administered 2018-06-20: 25 ug via INTRAVENOUS
  Administered 2018-06-20 (×3): 50 ug via INTRAVENOUS

## 2018-06-20 MED ORDER — FENTANYL CITRATE (PF) 250 MCG/5ML IJ SOLN
INTRAMUSCULAR | Status: AC
  Filled 2018-06-20: qty 5

## 2018-06-20 MED ORDER — PROPOFOL 10 MG/ML IV BOLUS
INTRAVENOUS | Status: DC | PRN
  Administered 2018-06-20: 180 mg via INTRAVENOUS

## 2018-06-20 MED ORDER — ONDANSETRON HCL 4 MG/2ML IJ SOLN: 4.0000 mg | Freq: Once | INTRAMUSCULAR | Status: DC | PRN

## 2018-06-20 MED ORDER — CEFAZOLIN SODIUM-DEXTROSE 2-4 GM/100ML-% IV SOLN
2.0000 g | INTRAVENOUS | Status: AC
  Administered 2018-06-20: 2 g via INTRAVENOUS
  Filled 2018-06-20: qty 100

## 2018-06-20 MED ORDER — 0.9 % SODIUM CHLORIDE (POUR BTL) OPTIME
TOPICAL | Status: DC | PRN
  Administered 2018-06-20: 1000 mL

## 2018-06-20 MED ORDER — BUPIVACAINE HCL 0.25 % IJ SOLN
INTRAMUSCULAR | Status: DC | PRN
  Administered 2018-06-20: 26 mL

## 2018-06-20 MED ORDER — SODIUM CHLORIDE 0.9% FLUSH: 3.0000 mL | Freq: Two times a day (BID) | INTRAVENOUS | Status: DC

## 2018-06-20 MED ORDER — ACETAMINOPHEN 325 MG PO TABS: 650.0000 mg | ORAL_TABLET | ORAL | Status: DC | PRN

## 2018-06-20 MED ORDER — TRAMADOL HCL 50 MG PO TABS: 50.0000 mg | ORAL_TABLET | Freq: Four times a day (QID) | ORAL | 0 refills | Status: DC | PRN

## 2018-06-20 MED ORDER — ONDANSETRON HCL 4 MG/2ML IJ SOLN
INTRAMUSCULAR | Status: DC | PRN
  Administered 2018-06-20: 4 mg via INTRAVENOUS

## 2018-06-20 MED ORDER — MIDAZOLAM HCL 2 MG/2ML IJ SOLN
INTRAMUSCULAR | Status: AC
  Filled 2018-06-20: qty 2

## 2018-06-20 MED ORDER — MIDAZOLAM HCL 5 MG/5ML IJ SOLN
INTRAMUSCULAR | Status: DC | PRN
  Administered 2018-06-20: 2 mg via INTRAVENOUS

## 2018-06-20 MED ORDER — PHENYLEPHRINE 40 MCG/ML (10ML) SYRINGE FOR IV PUSH (FOR BLOOD PRESSURE SUPPORT)
PREFILLED_SYRINGE | INTRAVENOUS | Status: DC | PRN
  Administered 2018-06-20: 40 ug via INTRAVENOUS

## 2018-06-20 MED ORDER — SODIUM CHLORIDE 0.9% FLUSH: 3.0000 mL | INTRAVENOUS | Status: DC | PRN

## 2018-06-20 SURGICAL SUPPLY — 44 items
ADH SKN CLS APL DERMABOND .7 (GAUZE/BANDAGES/DRESSINGS) ×1
APPLIER CLIP 5 13 M/L LIGAMAX5 (MISCELLANEOUS)
APR CLP MED LRG 5 ANG JAW (MISCELLANEOUS)
BAG SPEC RTRVL LRG 6X4 10 (ENDOMECHANICALS) ×1
BLADE CLIPPER SURG (BLADE) ×2 IMPLANT
CANISTER SUCT 3000ML PPV (MISCELLANEOUS) ×3 IMPLANT
CHLORAPREP W/TINT 26ML (MISCELLANEOUS) ×3 IMPLANT
CLIP APPLIE 5 13 M/L LIGAMAX5 (MISCELLANEOUS) IMPLANT
COVER SURGICAL LIGHT HANDLE (MISCELLANEOUS) ×3 IMPLANT
COVER WAND RF STERILE (DRAPES) ×1 IMPLANT
CUTTER FLEX LINEAR 45M (STAPLE) ×3 IMPLANT
DERMABOND ADVANCED (GAUZE/BANDAGES/DRESSINGS) ×2
DERMABOND ADVANCED .7 DNX12 (GAUZE/BANDAGES/DRESSINGS) ×1 IMPLANT
DEVICE PMI PUNCTURE CLOSURE (MISCELLANEOUS) ×3 IMPLANT
ELECT REM PT RETURN 9FT ADLT (ELECTROSURGICAL) ×3
ELECTRODE REM PT RTRN 9FT ADLT (ELECTROSURGICAL) ×1 IMPLANT
GLOVE BIO SURGEON STRL SZ 6 (GLOVE) ×3 IMPLANT
GLOVE INDICATOR 6.5 STRL GRN (GLOVE) ×3 IMPLANT
GOWN STRL REUS W/ TWL LRG LVL3 (GOWN DISPOSABLE) ×3 IMPLANT
GOWN STRL REUS W/TWL LRG LVL3 (GOWN DISPOSABLE) ×9
KIT BASIN OR (CUSTOM PROCEDURE TRAY) ×3 IMPLANT
KIT TURNOVER KIT B (KITS) ×3 IMPLANT
NDL INSUFFLATION 14GA 120MM (NEEDLE) ×1 IMPLANT
NEEDLE INSUFFLATION 14GA 120MM (NEEDLE) ×3 IMPLANT
NS IRRIG 1000ML POUR BTL (IV SOLUTION) ×3 IMPLANT
PAD ARMBOARD 7.5X6 YLW CONV (MISCELLANEOUS) ×6 IMPLANT
POUCH SPECIMEN RETRIEVAL 10MM (ENDOMECHANICALS) ×3 IMPLANT
RELOAD 45 VASCULAR/THIN (ENDOMECHANICALS) IMPLANT
RELOAD STAPLE 45 2.5 WHT GRN (ENDOMECHANICALS) IMPLANT
RELOAD STAPLE 45 3.5 BLU ETS (ENDOMECHANICALS) IMPLANT
RELOAD STAPLE TA45 3.5 REG BLU (ENDOMECHANICALS) IMPLANT
SCISSORS ENDO CVD 5DCS (MISCELLANEOUS) ×2 IMPLANT
SET IRRIG TUBING LAPAROSCOPIC (IRRIGATION / IRRIGATOR) ×3 IMPLANT
SET TUBE SMOKE EVAC HIGH FLOW (TUBING) ×3 IMPLANT
SHEARS HARMONIC ACE PLUS 36CM (ENDOMECHANICALS) ×2 IMPLANT
SLEEVE ENDOPATH XCEL 5M (ENDOMECHANICALS) ×3 IMPLANT
SPECIMEN JAR SMALL (MISCELLANEOUS) ×3 IMPLANT
SUT MNCRL AB 4-0 PS2 18 (SUTURE) ×3 IMPLANT
TOWEL OR 17X24 6PK STRL BLUE (TOWEL DISPOSABLE) ×3 IMPLANT
TRAY FOLEY CATH SILVER 16FR (SET/KITS/TRAYS/PACK) ×3 IMPLANT
TRAY LAPAROSCOPIC MC (CUSTOM PROCEDURE TRAY) ×3 IMPLANT
TROCAR XCEL 12X100 BLDLESS (ENDOMECHANICALS) ×3 IMPLANT
TROCAR XCEL NON-BLD 5MMX100MML (ENDOMECHANICALS) ×3 IMPLANT
WATER STERILE IRR 1000ML POUR (IV SOLUTION) ×3 IMPLANT

## 2018-06-20 NOTE — Discharge Instructions (Signed)
LAPAROSCOPIC SURGERY: POST OP INSTRUCTIONS ° °###################################################################### ° °EAT °Gradually transition to a high fiber diet with a fiber supplement over the next few weeks after discharge.  Start with a pureed / full liquid diet (see below) ° °WALK °Walk an hour a day.  Control your pain to do that.   ° °CONTROL PAIN °Control pain so that you can walk, sleep, tolerate sneezing/coughing, go up/down stairs. ° °HAVE A BOWEL MOVEMENT DAILY °Keep your bowels regular to avoid problems.  OK to try a laxative to override constipation.  OK to use an antidairrheal to slow down diarrhea.  Call if not better after 2 tries ° °CALL IF YOU HAVE PROBLEMS/CONCERNS °Call if you are still struggling despite following these instructions. °Call if you have concerns not answered by these instructions ° °###################################################################### ° ° ° °1. DIET: Follow a light bland diet the first 24 hours after arrival home, such as soup, liquids, crackers, etc.  Be sure to include lots of fluids daily.  Avoid fast food or heavy meals as your are more likely to get nauseated.  Eat a low fat the next few days after surgery.   ° °2. Take your usually prescribed home medications unless otherwise directed. ° °3. PAIN CONTROL: °a. Pain is best controlled by a usual combination of three different methods TOGETHER: °i. Ice/Heat °ii. Over the counter pain medication °iii. Prescription pain medication °b. Most patients will experience some swelling and bruising around the incisions.  Ice packs or heating pads (30-60 minutes up to 6 times a day) will help. Use ice for the first few days to help decrease swelling and bruising, then switch to heat to help relax tight/sore spots and speed recovery.  Some people prefer to use ice alone, heat alone, alternating between ice & heat.  Experiment to what works for you.  Swelling and bruising can take several weeks to resolve.   °c. It  is helpful to take an over-the-counter pain medication regularly for the first few weeks.  Choose one of the following that works best for you: °i. Naproxen (Aleve, etc)  Two 220mg tabs twice a day °ii. Ibuprofen (Advil, etc) Three 200mg tabs four times a day (every meal & bedtime) °iii. Acetaminophen (Tylenol, etc) 500-650mg four times a day (every meal & bedtime) °d. A  prescription for pain medication (such as oxycodone, hydrocodone, tramadol, gabapentin, methocarbamol, etc) should be given to you upon discharge.  Take your pain medication as prescribed.  °i. If you are having problems/concerns with the prescription medicine (does not control pain, nausea, vomiting, rash, itching, etc), please call us [phone] to see if we need to switch you to a different pain medicine that will work better for you and/or control your side effect better. °ii. If you need a refill on your pain medication, please give us 48 hour notice.  contact your pharmacy.  They will contact our office to request authorization. Prescriptions will not be filled after 5 pm or on week-ends ° °4. Avoid getting constipated.   °a. Between the surgery and the pain medications, it is common to experience some constipation.   °b. Increasing fluid intake and taking a fiber supplement (such as Metamucil, Citrucel, FiberCon, MiraLax, etc) 1-2 times a day regularly will usually help prevent this problem from occurring.   °c. A mild laxative (prune juice, Milk of Magnesia, MiraLax, etc) should be taken according to package directions if there are no bowel movements after 48 hours.   °5. Watch out for   diarrhea.   a. If you have many loose bowel movements, simplify your diet to bland foods & liquids for a few days.   b. Stop any stool softeners and decrease your fiber supplement.   c. Switching to mild anti-diarrheal medications (Kayopectate, Pepto Bismol) can help.   d. If this worsens or does not improve, please call us.  6. Wash / shower every  day.  You may shower over the skin glue which is waterproof.  Continue to shower over incision(s) after the dressing is off.  7. Skin glue will flake off after 2 weeks.  You may leave the incision open to air.  You may replace a dressing/Band-Aid to cover the incision for comfort if you wish.   8. ACTIVITIES as tolerated:   a. You may resume regular (light) daily activities beginning the next day--such as daily self-care, walking, climbing stairs--gradually increasing activities as tolerated.  If you can walk 30 minutes without difficulty, it is safe to try more intense activity such as jogging, treadmill, bicycling, low-impact aerobics, swimming, etc. b. Save the most intensive and strenuous activity for last such as sit-ups, heavy lifting, contact sports, etc  Refrain from any heavy lifting or straining until you are off narcotics for pain control.   c. DO NOT PUSH THROUGH PAIN.  Let pain be your guide: If it hurts to do something, don't do it.  Pain is your body warning you to avoid that activity for another week until the pain goes down. d. You may drive when you are no longer taking prescription pain medication, you can comfortably wear a seatbelt, and you can safely maneuver your car and apply brakes. e. Dennis Bast may have sexual intercourse when it is comfortable.  9. FOLLOW UP in our office a. Please call [facility] at [phone] to set up an appointment to see your surgeon in the office for a follow-up appointment approximately 2-3 weeks after your surgery. b. Make sure that you call for this appointment the day you arrive home to insure a convenient appointment time.  10. IF YOU HAVE DISABILITY OR FAMILY LEAVE FORMS, BRING THEM TO THE OFFICE FOR PROCESSING.  DO NOT GIVE THEM TO YOUR DOCTOR.   WHEN TO CALL us [phone]: 1. Poor pain control 2. Reactions / problems with new medications (rash/itching, nausea, etc)  3. Fever over 101.5 F (38.5 C) 4. Inability to urinate 5. Nausea and/or  vomiting 6. Worsening swelling or bruising 7. Continued bleeding from incision. 8. Increased pain, redness, or drainage from the incision   The clinic staff is available to answer your questions during regular business hours (8:30am-5pm).  Please dont hesitate to call and ask to speak to one of our nurses for clinical concerns.   If you have a medical emergency, go to the nearest emergency room or call 911.  A surgeon from [facility] is always on call at the [facility], [place], [place], [place], [place]  [address] ? MAIN: [phone] ? TOLL FREE: [phone] ?  FAX [phone] [email or web address]

## 2018-06-20 NOTE — Anesthesia Procedure Notes (Signed)
Procedure Name: Intubation Date/Time: 06/20/2018 7:34 AM Performed by: Wilburn Cornelia, CRNA Pre-anesthesia Checklist: Patient identified, Emergency Drugs available, Suction available, Patient being monitored and Timeout performed Patient Re-evaluated:Patient Re-evaluated prior to induction Oxygen Delivery Method: Circle system utilized Preoxygenation: Pre-oxygenation with 100% oxygen Induction Type: IV induction Ventilation: Mask ventilation without difficulty and Oral airway inserted - appropriate to patient size Laryngoscope Size: Mac and 3 Grade View: Grade I Tube type: Oral Tube size: 7.5 mm Number of attempts: 1 Airway Equipment and Method: Stylet Placement Confirmation: ETT inserted through vocal cords under direct vision,  CO2 detector,  breath sounds checked- equal and bilateral and positive ETCO2 Secured at: 23 cm Tube secured with: Tape Dental Injury: Teeth and Oropharynx as per pre-operative assessment  Comments: DLx1 by Fernanda Drum

## 2018-06-20 NOTE — Op Note (Signed)
Operative Report  Ian Walsh 63 y.o. male  [id number]  [id number]  06/20/2018  Surgeon: Clovis Riley   Assistant: none  Procedure performed: Laparoscopic Appendectomy  Preop diagnosis: history of perforated appendicitis treated with percutaneous drain  Post-op diagnosis/intraop findings: same. 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 Antibiotics were administered. SCD's were applied. Foley catheter inserted which is removed at the end of the case. General endotracheal anesthesia was initiated and a formal time-out was performed. The abdomen was prepped and draped in the usual sterile fashion and the abdomen was entered using an optical entry in the left upper quadrant and insufflated to 15 mmHg. The abdomen was inspected and there is no evidence of injury from our entry or significant abnormality. A suprapubic 5 mm trocar and a left lower quadrant 12 mm trocar were introduced under direct visualization following infiltration with local. The patient was then placed in Trendelenburg and rotated to the left and the small bowel was reflected cephalad. The appendix was visualized: it is retrocecal with dense adhesions of the terminal ileum mesentery to the appendix and mesoappendix and with a bulbous appearance of the base, no free fluid or purulence was present. A combination of blunt dissection and harmonic scalpel were used to free it of its retroperitoneal attachments. Similarly the TI mesenteric adhesions were carefully divided until the appendix could be skeletonized. Great care was taken to ensure no injury to surrounding retroperitoneal structures, cecum or terminal ileum. The mesoappendix was divided with the harmonic scalpel and a blue load linear cutting stapler was used to  transect the appendix from the cecum, taking a small cuff of healthy cecum with the specimen. Hemostasis was ensured. The appendix was placed in an ecosac and removed through our 12 mm trocar. The abdomen was again inspected and hemostasis confirmed. The staple line appears viable and intact. There is noted to be a 6-22mm defect in the TI mesentery from adhesiolysis and the bowel appears well perfused and without injury. Omentum is brought down to cover the staple line. The 65mm trocar site in the left lower quadrant was closed with a 0 vicryl in the fascia under direct visualization using a PMI device. The abdomen was desufflated and all trocars removed. The skin incisions were closed with running subcuticular monocryl and Dermabond. The patient was awakened, extubated and transported to the recovery room in stable condition.   All counts were correct at the completion of the case.

## 2018-06-20 NOTE — Anesthesia Postprocedure Evaluation (Signed)
Anesthesia Post Note  Patient: Ian Walsh  Procedure(s) Performed: APPENDECTOMY LAPAROSCOPIC (N/A Abdomen)     Patient location during evaluation: PACU Anesthesia Type: General Level of consciousness: awake and alert Pain management: pain level controlled Vital Signs Assessment: post-procedure vital signs reviewed and stable Respiratory status: spontaneous breathing, nonlabored ventilation, respiratory function stable and patient connected to nasal cannula oxygen Cardiovascular status: blood pressure returned to baseline and stable Postop Assessment: no apparent nausea or vomiting Anesthetic complications: no    Last Vitals:  Vitals:   06/20/18 0940 06/20/18 1000  BP: 135/84   Pulse: 67   Resp: 20   Temp:  36.7 C  SpO2: 100%     Last Pain:  Vitals:   06/20/18 1004  TempSrc:   PainSc: 0-No pain                 Json Koelzer,Adair COKER

## 2018-06-20 NOTE — H&P (Signed)
Surgical H&P  CC: history of perforated appendicitis  HPI: Very nice gentleman who was admitted 12/24-27/19 with perforated appendicitis treated with a drain, which was removed on January 10.  He denies any abdominal pain or fevers since that time.  Reports normal bowel movements.  He has no prior history of abdominal surgery.he presents today to discuss interval appendectomy.      Allergies  Allergen Reactions  . Codeine Nausea Only        Past Medical History:  Diagnosis Date  . GERD (gastroesophageal reflux disease)   . Pneumonia 2010's X 1   "walking pneumonia"         Past Surgical History:  Procedure Laterality Date  . IR SINUS/FIST TUBE CHK-NON GI  04/29/2018  . NO PAST SURGERIES      No family history on file.  Social History        Socioeconomic History  . Marital status: Married    Spouse name: Not on file  . Number of children: Not on file  . Years of education: Not on file  . Highest education level: Not on file  Occupational History  . Not on file  Social Needs  . Financial resource strain: Not on file  . Food insecurity:    Worry: Not on file    Inability: Not on file  . Transportation needs:    Medical: Not on file    Non-medical: Not on file  Tobacco Use  . Smoking status: Never Smoker  . Smokeless tobacco: Never Used  Substance and Sexual Activity  . Alcohol use: Yes    Alcohol/week: 2.0 standard drinks    Types: 2 Standard drinks or equivalent per week  . Drug use: Never  . Sexual activity: Yes  Lifestyle  . Physical activity:    Days per week: Not on file    Minutes per session: Not on file  . Stress: Not on file  Relationships  . Social connections:    Talks on phone: Not on file    Gets together: Not on file    Attends religious service: Not on file    Active member of club or organization: Not on file    Attends meetings of clubs or organizations: Not on file    Relationship  status: Not on file  Other Topics Concern  . Not on file  Social History Narrative  . Not on file          Current Outpatient Medications on File Prior to Visit  Medication Sig Dispense Refill  . acetaminophen (TYLENOL) 500 MG tablet Take 1,000 mg by mouth every 6 (six) hours as needed for fever.    Marland Kitchen aspirin 81 MG tablet Take 81 mg by mouth daily.    . calcium carbonate (TUMS - DOSED IN MG ELEMENTAL CALCIUM) 500 MG chewable tablet Chew 1 tablet (200 mg of elemental calcium total) by mouth as needed for indigestion or heartburn.    . naproxen (NAPROSYN) 500 MG tablet Take 250 mg by mouth as needed for mild pain or moderate pain.    Marland Kitchen omeprazole (PRILOSEC) 20 MG capsule Take 20 mg by mouth as needed (reflux).     . polyethylene glycol (MIRALAX / GLYCOLAX) packet Take 17 g by mouth daily as needed for mild constipation or moderate constipation.    . Sodium Chloride Flush (SALINE FLUSH) 0.9 % SOLN Give 5 mLs by tube daily. 20 Syringe 0  . traMADol (ULTRAM) 50 MG tablet Take 1 tablet (50  mg total) by mouth every 6 (six) hours as needed for moderate pain. 15 tablet 0   No current facility-administered medications on file prior to visit.     Review of Systems: a complete, 10pt review of systems was completed with pertinent positives and negatives as documented in the HPI  Physical Exam: There were no vitals filed for this visit. Gen: alert and well appearing Eye: extraocular motion intact, no scleral icterus ENT: moist mucus membranes, dentition intact Neck: no mass or thyromegaly Chest: unlabored respirations, symmetrical air entry, clear bilaterally CV: regular rate and rhythm, no pedal edema Abdomen: soft, nontender, nondistended. No mass or organomegaly. Well-healed drain site just inferior to the left of the umbilicus. MSK: strength symmetrical throughout, no deformity Neuro: grossly intact, normal gait Psych: normal mood and affect, appropriate insight Skin:  warm and dry, no rash or lesion on limited exam   CBC Latest Ref Rng & Units 04/14/2018 04/13/2018 04/12/2018  WBC 4.0 - 10.5 K/uL 6.3 9.4 13.7(H)  Hemoglobin 13.0 - 17.0 g/dL 12.1(L) 12.7(L) 14.2  Hematocrit 39.0 - 52.0 % 35.5(L) 37.1(L) 41.0  Platelets 150 - 400 K/uL 327 335 352    CMP Latest Ref Rng & Units 04/14/2018 04/13/2018 04/12/2018  Glucose 70 - 99 mg/dL 113(H) 121(H) -  BUN 6 - 20 mg/dL 11 13 -  Creatinine 0.61 - 1.24 mg/dL 0.97 0.99 1.08  Sodium 135 - 145 mmol/L 138 140 -  Potassium 3.5 - 5.1 mmol/L 4.6 4.3 -  Chloride 98 - 111 mmol/L 103 103 -  CO2 22 - 32 mmol/L 26 25 -  Calcium 8.9 - 10.3 mg/dL 8.3(L) 8.5(L) -  Total Protein 6.5 - 8.1 g/dL - 6.2(L) -  Total Bilirubin 0.3 - 1.2 mg/dL - 0.8 -  Alkaline Phos 38 - 126 U/L - 122 -  AST 15 - 41 U/L - 10(L) -  ALT 0 - 44 U/L - 24 -    RecentLabs       Lab Results  Component Value Date   INR 1.08 04/12/2018      Imaging: ImagingResults(Last48hours)  No results found.      A/P: PERFORATED APPENDICITIS (K35.32) Story: Now resolved. I recommend proceeding with laparoscopic appendectomy 6 weeks or more from the time of drain removal. We discussed the surgery including risks of bleeding, infection, pain, scarring, injury to intra-abdominal structures, conversion to open surgery or more extensive resection, risk of staple line leak or delayed abscess, failure to resolve symptoms, postoperative ileus, incisional hernia, as well as general risks of DVT/PE, pneumonia, stroke, heart attack, death. Questions were welcomed and answered to the patient's satisfaction. Plan surgery around or after February 21.    Romana Juniper, MD [facility], [place] Pager [phone]

## 2018-06-20 NOTE — Transfer of Care (Signed)
Immediate Anesthesia Transfer of Care Note  Patient: Ian Walsh  Procedure(s) Performed: APPENDECTOMY LAPAROSCOPIC (N/A Abdomen)  Patient Location: PACU  Anesthesia Type:General  Level of Consciousness: awake and alert   Airway & Oxygen Therapy: Patient Spontanous Breathing and Patient connected to nasal cannula oxygen  Post-op Assessment: Report given to RN and Post -op Vital signs reviewed and stable  Post vital signs: Reviewed and stable  Last Vitals:  Vitals Value Taken Time  BP    Temp    Pulse    Resp    SpO2      Last Pain:  Vitals:   06/20/18 0608  TempSrc: Oral  PainSc:          Complications: No apparent anesthesia complications

## 2018-06-20 NOTE — Anesthesia Preprocedure Evaluation (Signed)
Anesthesia Evaluation  Patient identified by MRN, date of birth, ID band Patient awake    Reviewed: Allergy & Precautions, NPO status , Patient's Chart, lab work & pertinent test results  Airway Mallampati: II  TM Distance: >3 FB Neck ROM: Full    Dental  (+) Teeth Intact, Dental Advisory Given   Pulmonary    breath sounds clear to auscultation       Cardiovascular  Rhythm:Regular Rate:Normal     Neuro/Psych    GI/Hepatic   Endo/Other    Renal/GU      Musculoskeletal   Abdominal   Peds  Hematology   Anesthesia Other Findings   Reproductive/Obstetrics                             Anesthesia Physical Anesthesia Plan  ASA: II  Anesthesia Plan: General   Post-op Pain Management:    Induction: Intravenous  PONV Risk Score and Plan: Ondansetron and Dexamethasone  Airway Management Planned: Oral ETT  Additional Equipment:   Intra-op Plan:   Post-operative Plan: Extubation in OR  Informed Consent: I have reviewed the patients History and Physical, chart, labs and discussed the procedure including the risks, benefits and alternatives for the proposed anesthesia with the patient or authorized representative who has indicated his/her understanding and acceptance.       Plan Discussed with: CRNA and Anesthesiologist  Anesthesia Plan Comments:         Anesthesia Quick Evaluation

## 2018-06-21 ENCOUNTER — Encounter (HOSPITAL_COMMUNITY): Payer: Self-pay | Admitting: Surgery

## 2018-07-12 DIAGNOSIS — D121 Benign neoplasm of appendix: Secondary | ICD-10-CM | POA: Diagnosis not present

## 2018-07-12 DIAGNOSIS — K3580 Unspecified acute appendicitis: Secondary | ICD-10-CM | POA: Diagnosis not present

## 2018-07-13 DIAGNOSIS — Z9049 Acquired absence of other specified parts of digestive tract: Secondary | ICD-10-CM | POA: Diagnosis not present

## 2018-07-13 DIAGNOSIS — Z48815 Encounter for surgical aftercare following surgery on the digestive system: Secondary | ICD-10-CM | POA: Diagnosis not present

## 2018-07-13 DIAGNOSIS — D373 Neoplasm of uncertain behavior of appendix: Secondary | ICD-10-CM | POA: Diagnosis not present

## 2018-07-25 DIAGNOSIS — J029 Acute pharyngitis, unspecified: Secondary | ICD-10-CM | POA: Diagnosis not present

## 2018-07-25 DIAGNOSIS — R509 Fever, unspecified: Secondary | ICD-10-CM | POA: Diagnosis not present

## 2018-08-05 DIAGNOSIS — J029 Acute pharyngitis, unspecified: Secondary | ICD-10-CM | POA: Diagnosis not present

## 2018-09-20 ENCOUNTER — Other Ambulatory Visit: Payer: Self-pay | Admitting: Family Medicine

## 2018-09-20 DIAGNOSIS — R911 Solitary pulmonary nodule: Secondary | ICD-10-CM

## 2018-10-03 ENCOUNTER — Other Ambulatory Visit: Payer: Self-pay | Admitting: Family Medicine

## 2018-10-07 ENCOUNTER — Other Ambulatory Visit: Payer: Self-pay

## 2018-10-07 ENCOUNTER — Ambulatory Visit
Admission: RE | Admit: 2018-10-07 | Discharge: 2018-10-07 | Disposition: A | Discharge status: Home / Self Care | Payer: 59 | Source: Ambulatory Visit | Attending: Family Medicine | Admitting: Family Medicine

## 2018-10-07 DIAGNOSIS — R911 Solitary pulmonary nodule: Secondary | ICD-10-CM

## 2019-05-15 ENCOUNTER — Other Ambulatory Visit: Payer: Self-pay | Admitting: Family Medicine

## 2019-05-15 DIAGNOSIS — E278 Other specified disorders of adrenal gland: Secondary | ICD-10-CM

## 2019-07-16 ENCOUNTER — Ambulatory Visit (INDEPENDENT_AMBULATORY_CARE_PROVIDER_SITE_OTHER): Payer: 59

## 2019-07-16 ENCOUNTER — Other Ambulatory Visit: Payer: Self-pay

## 2019-07-16 ENCOUNTER — Encounter (HOSPITAL_COMMUNITY): Payer: Self-pay | Admitting: Emergency Medicine

## 2019-07-16 ENCOUNTER — Ambulatory Visit (HOSPITAL_COMMUNITY)
Admission: EM | Admit: 2019-07-16 | Discharge: 2019-07-16 | Disposition: A | Discharge status: Home / Self Care | Payer: 59

## 2019-07-16 DIAGNOSIS — M546 Pain in thoracic spine: Secondary | ICD-10-CM

## 2019-07-16 DIAGNOSIS — R079 Chest pain, unspecified: Secondary | ICD-10-CM

## 2019-07-16 MED ORDER — MELOXICAM 7.5 MG PO TABS: 7.5000 mg | ORAL_TABLET | Freq: Every day | ORAL | 0 refills | Status: AC

## 2019-07-16 MED ORDER — TIZANIDINE HCL 4 MG PO TABS: 4.0000 mg | ORAL_TABLET | Freq: Three times a day (TID) | ORAL | 0 refills | Status: AC | PRN

## 2019-07-16 NOTE — ED Triage Notes (Signed)
left back and shoulder blade pain started on Friday.  Patient commented on left chest started hurting about the same time.   Chest is intermittent.  Patient recently changed reflux medicine.   Denies sob, denies diaphoresis.

## 2019-07-16 NOTE — ED Provider Notes (Signed)
[name]   MRN: [id number] DOB: January 12, 1956  Subjective:   Ian Walsh is a 64 y.o. male presenting for 2-day history of acute onset left upper back, left-sided upper chest pain.  Patient states that symptoms happened roughly around the same time and are intermittent.  He called his PCP who expressed concerned about needing a chest x-ray given his history of walking pneumonia.  Has not taken medications for relief.  Denies history of heart conditions.  Patient does have a history of acid reflux and takes Dexilant for this.  Denies smoking history.  Denies fever, cough, shortness of breath, fall, trauma.  Patient has had a mild intermittent runny stuffy nose but has been working outdoors some this past week.  No current facility-administered medications for this encounter.  Current Outpatient Medications:  .  aspirin EC 81 MG tablet, Take 81 mg by mouth daily., Disp: , Rfl:  .  NON FORMULARY, Reflux medicine that begins with a "c", Disp: , Rfl:  .  acetaminophen (TYLENOL) 500 MG tablet, Take 1,000 mg by mouth every 6 (six) hours as needed for moderate pain or fever. , Disp: , Rfl:  .  calcium carbonate (TUMS - DOSED IN MG ELEMENTAL CALCIUM) 500 MG chewable tablet, Chew 1 tablet (200 mg of elemental calcium total) by mouth as needed for indigestion or heartburn., Disp: , Rfl:  .  DEXILANT 60 MG capsule, Take 1 capsule by mouth daily., Disp: , Rfl:    Allergies  Allergen Reactions  . Codeine Nausea Only    Past Medical History:  Diagnosis Date  . GERD (gastroesophageal reflux disease)   . Pneumonia 2010's X 1   "walking pneumonia"     Past Surgical History:  Procedure Laterality Date  . IR SINUS/FIST TUBE CHK-NON GI  04/29/2018  . LAPAROSCOPIC APPENDECTOMY N/A 06/20/2018   Procedure: APPENDECTOMY LAPAROSCOPIC;  Surgeon: Clovis Riley, MD;  Location: [facility] OR;  Service: General;  Laterality: N/A;  . NO PAST SURGERIES      Family History  Problem Relation Age of Onset    . CAD Father     Social History   Tobacco Use  . Smoking status: Never Smoker  . Smokeless tobacco: Never Used  Substance Use Topics  . Alcohol use: Yes    Alcohol/week: 4.0 standard drinks    Types: 2 Standard drinks or equivalent, 2 Cans of beer per week  . Drug use: Never    ROS   Objective:   Vitals: BP 137/80 (BP Location: Right Arm)   Pulse 67   Temp 98.6 F (37 C) (Oral)   Resp 18   SpO2 98%   Physical Exam Constitutional:      General: He is not in acute distress.    Appearance: Normal appearance. He is well-developed. He is not ill-appearing, toxic-appearing or diaphoretic.  HENT:     Head: Normocephalic and atraumatic.     Right Ear: External ear normal.     Left Ear: External ear normal.     Nose: Nose normal.     Mouth/Throat:     Mouth: Mucous membranes are moist.     Pharynx: Oropharynx is clear.  Eyes:     General: No scleral icterus.    Extraocular Movements: Extraocular movements intact.     Pupils: Pupils are equal, round, and reactive to light.  Cardiovascular:     Rate and Rhythm: Normal rate and regular rhythm.     Heart sounds: Normal heart sounds. No murmur.  No friction rub. No gallop.   Pulmonary:     Effort: Pulmonary effort is normal. No respiratory distress.     Breath sounds: Normal breath sounds. No stridor. No wheezing, rhonchi or rales.  Chest:     Chest wall: Tenderness (over area oultined) present. No mass, lacerations, deformity, swelling, crepitus or edema.    Musculoskeletal:     Thoracic back: Spasms and tenderness (along area outlined) present. No swelling, edema, deformity, signs of trauma, lacerations or bony tenderness. Normal range of motion. No scoliosis.       Back:  Neurological:     Mental Status: He is alert and oriented to person, place, and time.  Psychiatric:        Mood and Affect: Mood normal.        Behavior: Behavior normal.        Thought Content: Thought content normal.    ED ECG REPORT    Date: 07/16/2019  Rate: 57bpm  Rhythm: sinus bradycardia  QRS Axis: normal  Intervals: normal  ST/T Wave abnormalities: normal  Conduction Disutrbances:none  Narrative Interpretation: Sinus bradycardia 57 bpm, no previous ECG for comparison.  Old EKG Reviewed: none available  I have personally reviewed the EKG tracing and agree with the computerized printout as noted.   DG Chest 2 View  Result Date: 07/16/2019 CLINICAL DATA:  Chest pain EXAM: CHEST - 2 VIEW COMPARISON:  None. FINDINGS: Normal heart size. Mildly tortuous thoracic aorta. Otherwise normal mediastinal contour. No pneumothorax. No pleural effusion. Lungs appear clear, with no acute consolidative airspace disease and no pulmonary edema. IMPRESSION: No active cardiopulmonary disease. Electronically Signed   By: Ilona Sorrel M.D.   On: 07/16/2019 16:48   Assessment and Plan :   1. Acute left-sided thoracic back pain   2. Left-sided chest pain     Will manage for musculoskeletal type pain with meloxicam and tizanidine.  EKG and chest x-ray very reassuring.  Follow-up with PCP if symptoms persist. Counseled patient on potential for adverse effects with medications prescribed/recommended today, ER and return-to-clinic precautions discussed, patient verbalized understanding.    Jaynee Eagles, [place] 07/16/19 1654

## 2020-03-25 IMAGING — CT CT IMAGE GUIDED DRAINAGE BY PERCUTANEOUS CATHETER
1 of 2 series · 15 of 32 positions shown, 19 images · non-contrast
Comparison: none

INDICATION: Perforated appendicitis with right lower quadrant abscess

[Series 2: i-spiral 5.0 b40f · axial · 0.78mm/px · z∈[+1090,+1318]mm · 15 of 71 slices shown, 19 images]
[im 3/71  soft-tissue]
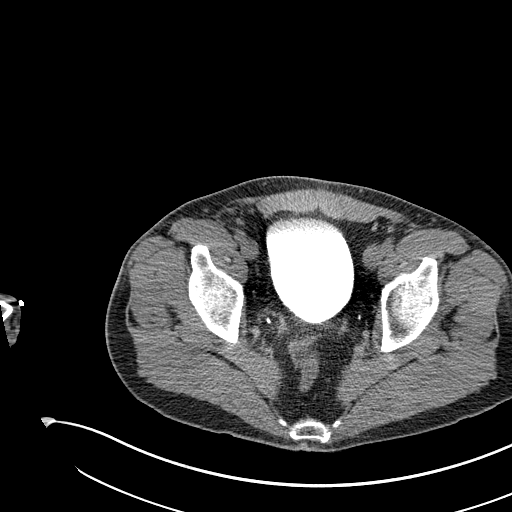
[im 3/71  bone]
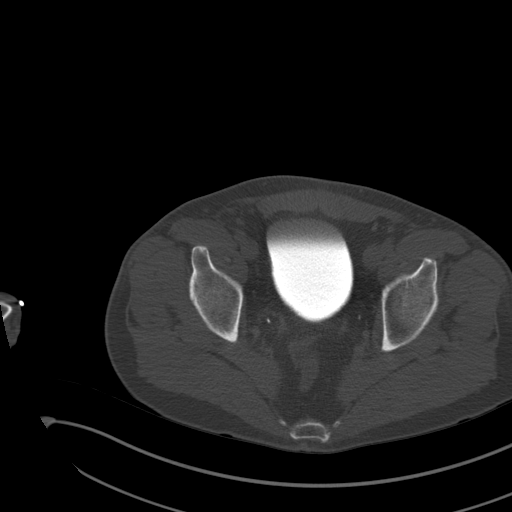
[im 9/71  soft-tissue]
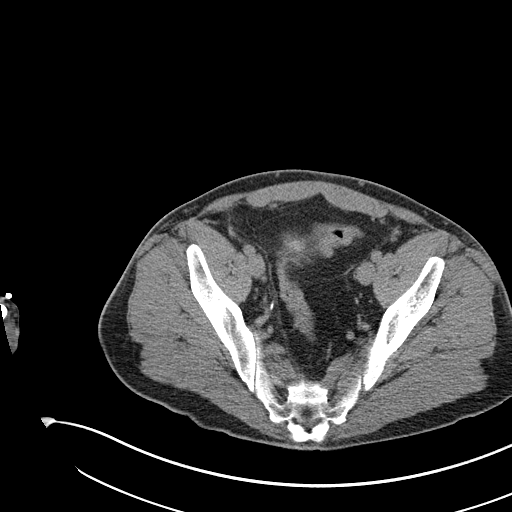
[im 15/71  soft-tissue]
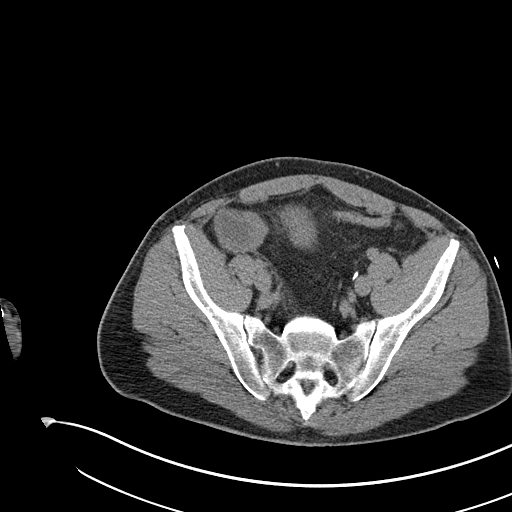
[im 20/71  soft-tissue]
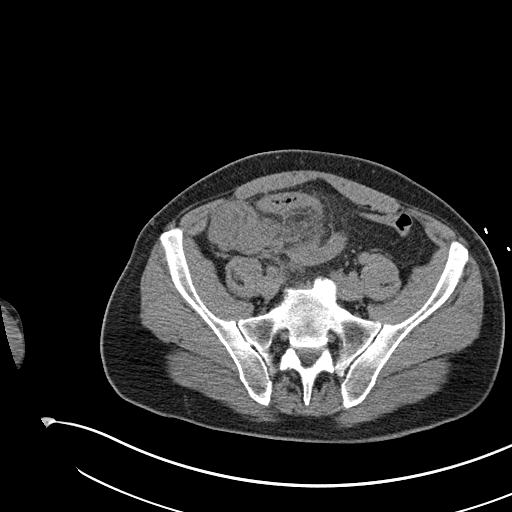
[im 26/71  soft-tissue]
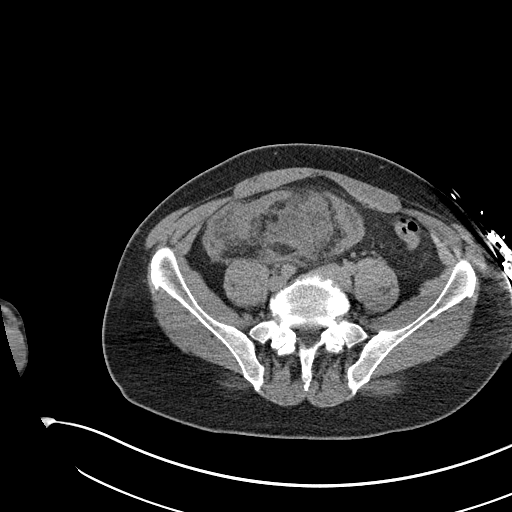
[im 31/71  soft-tissue]
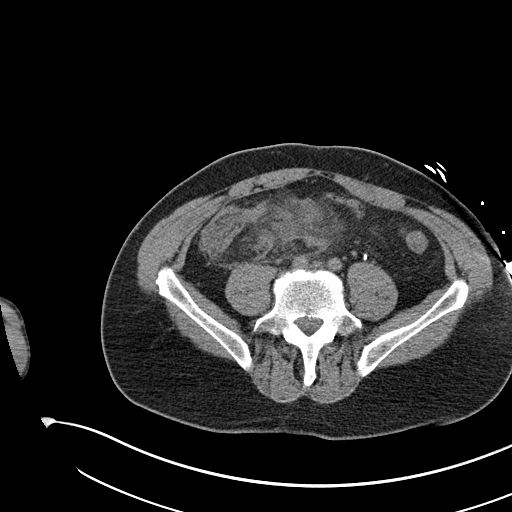
[im 37/71  soft-tissue]
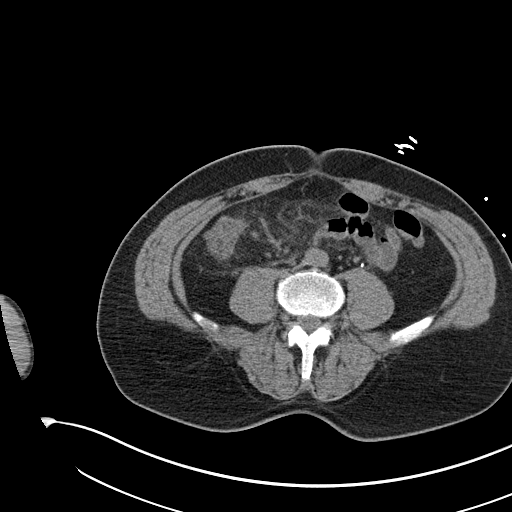
[im 40/71  soft-tissue]
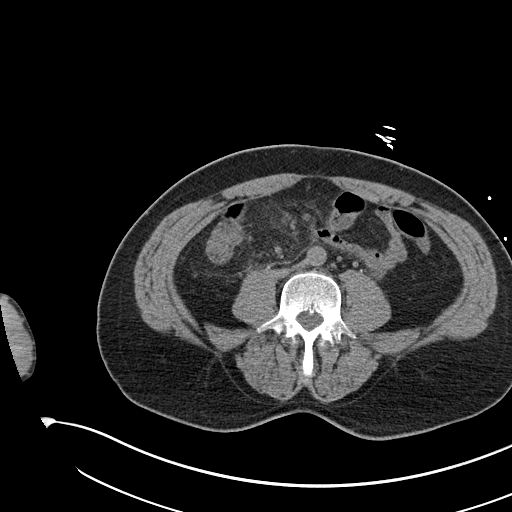
[im 45/71  soft-tissue]
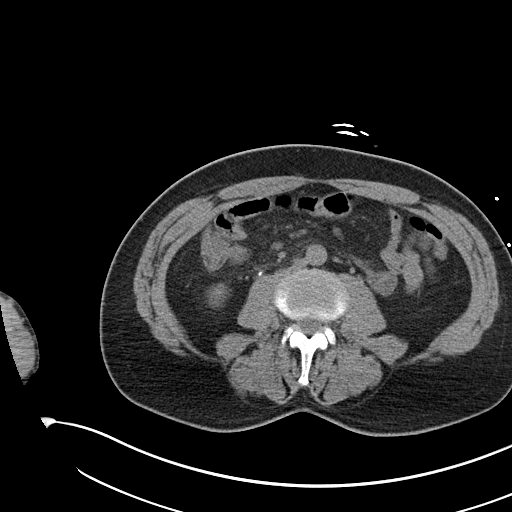
[im 45/71  bone]
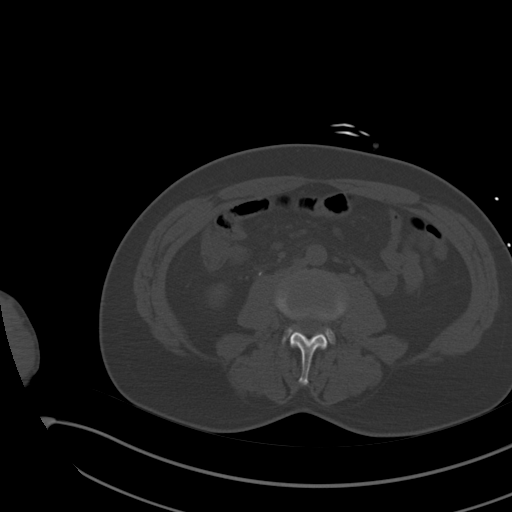
[im 51/71  soft-tissue]
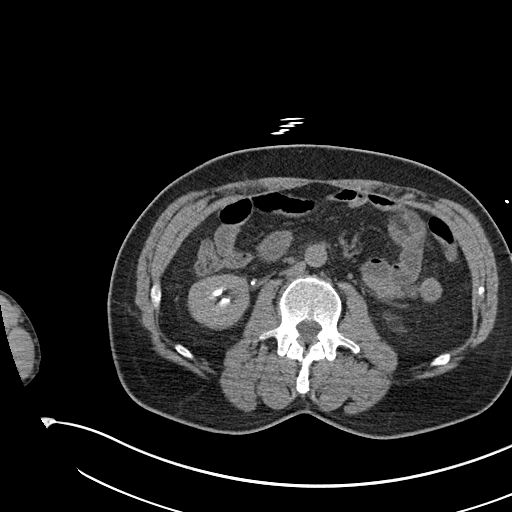
[im 57/71  soft-tissue]
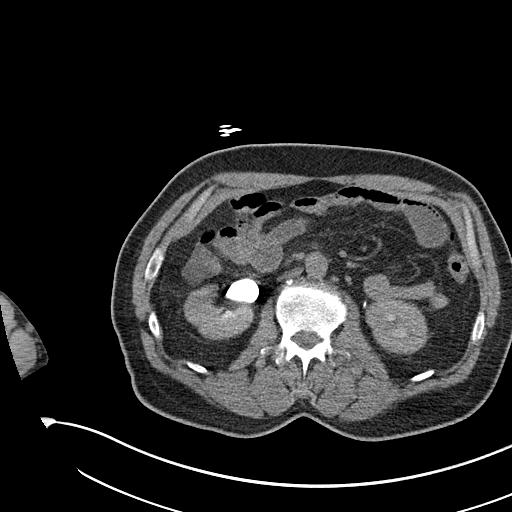
[im 59/71  lung]
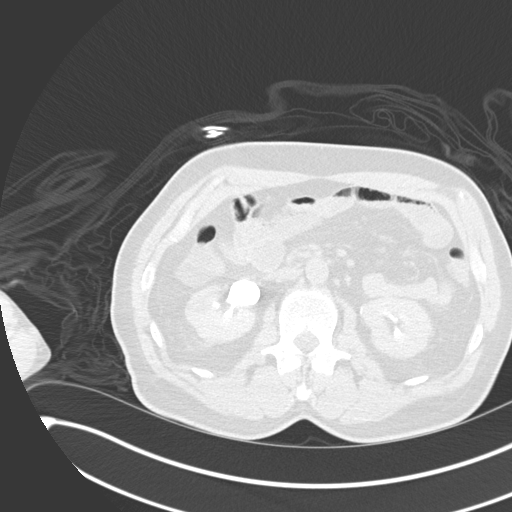
[im 62/71  soft-tissue]
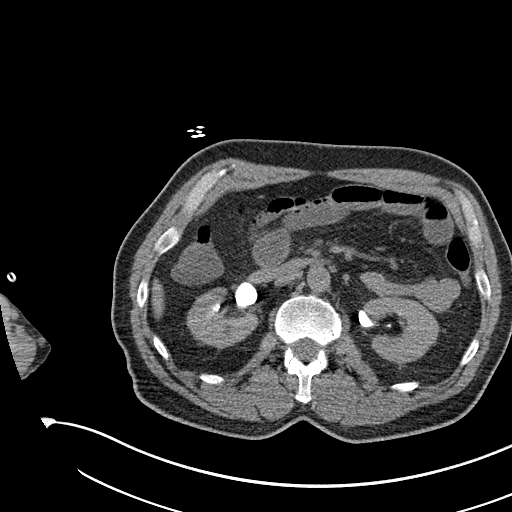
[im 62/71  lung]
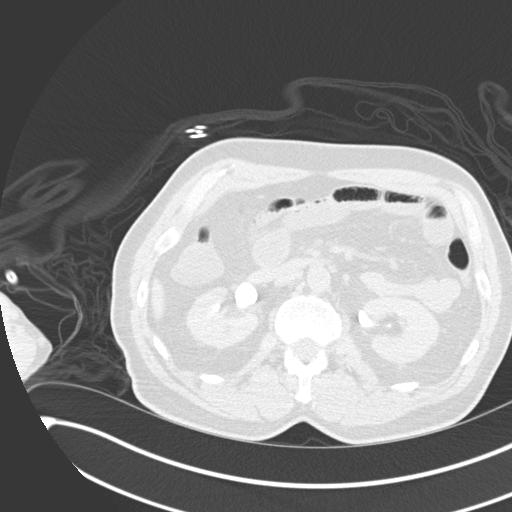
[im 65/71  lung]
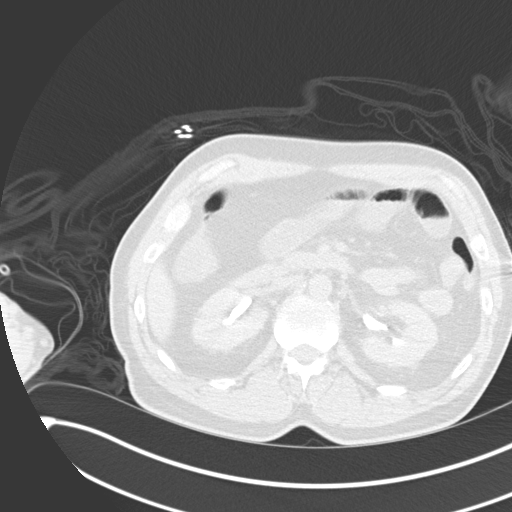
[im 68/71  soft-tissue]
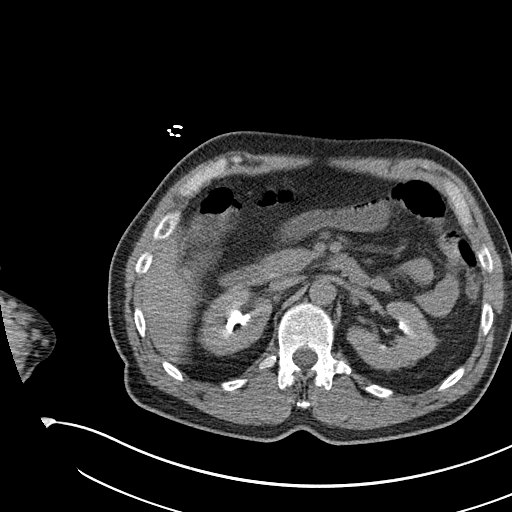
[im 68/71  lung]
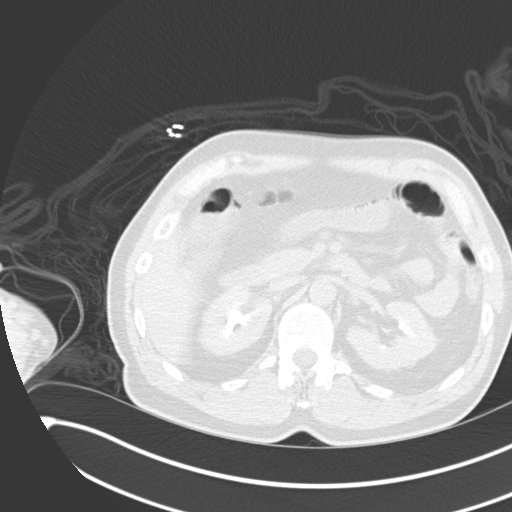

[15 of 32 positions shown; findings below may reference images not displayed]

EXAM:
CT DRAINAGE RIGHT LOWER QUADRANT ABSCESS

MEDICATIONS:
The patient is currently admitted to the hospital and receiving
intravenous antibiotics. The antibiotics were administered within an
appropriate time frame prior to the initiation of the procedure.

ANESTHESIA/SEDATION:
Fentanyl 100 mcg IV; Versed 2.0 mg IV

Moderate Sedation Time:  15 minutes

The patient was continuously monitored during the procedure by the
interventional radiology nurse under my direct supervision.

COMPLICATIONS:
None immediate.

PROCEDURE:
Informed written consent was obtained from the patient after a
thorough discussion of the procedural risks, benefits and
alternatives. All questions were addressed. Maximal Sterile Barrier
Technique was utilized including caps, mask, sterile gowns, sterile
gloves, sterile drape, hand hygiene and skin antiseptic. A timeout
was performed prior to the initiation of the procedure.

Previous imaging reviewed. Patient positioned supine. Noncontrast
localization CT performed. The right lower quadrant abscess was
localized. Overlying skin marked for a anterior oblique approach
just below the umbilicus.

Under sterile conditions and local anesthesia, an 18 gauge 10 cm
access needle was advanced percutaneously an anterior oblique
approach into the abscess. Needle position confirmed with CT.
Syringe aspiration yielded purulent fecal contaminated fluid.
Guidewire advanced followed by tract dilatation to insert a 10
French drain. Drain catheter position confirmed in the abscess.
Syringe aspiration yielded 40 cc purulent fecal contaminated fluid.
Drain catheter secured with Prolene suture and connected to external
suction bulb. Sterile dressing applied. No immediate complication.
Patient tolerated the procedure well.
IMPRESSION: Successful CT-guided right lower quadrant abscess drain placement

## 2020-03-25 IMAGING — CT CT ABD-PELV W/ CM
2 of 5 series · 14 of 46 positions shown, 16 images · IV contrast (omnipaque)
Comparison: None

CLINICAL DATA: Right lower quadrant pain for 1 week. Intermittent
fever. Abdominal distention.

EXAM:
CT ABDOMEN AND PELVIS WITH CONTRAST
TECHNIQUE: Multidetector CT imaging of the abdomen and pelvis was performed
using the standard protocol following bolus administration of
intravenous contrast.
CONTRAST:  100mL OMNIPAQUE IOHEXOL 300 MG/ML  SOLN

[Series 3: abdomen 5.0 · axial · 0.73mm/px · z∈[+817,+1202]mm · 11 of 93 slices shown, 13 images]
[im 8/93  soft-tissue]
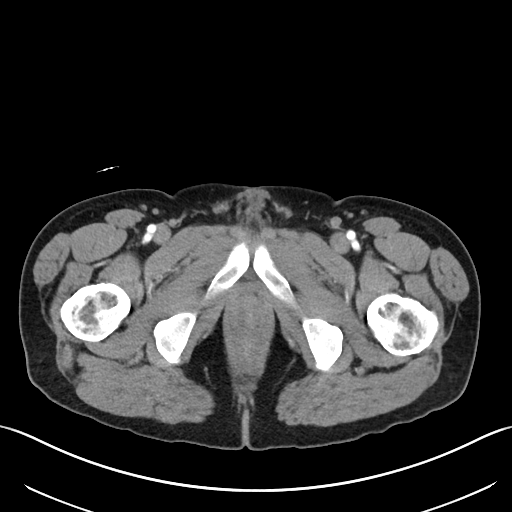
[im 8/93  bone]
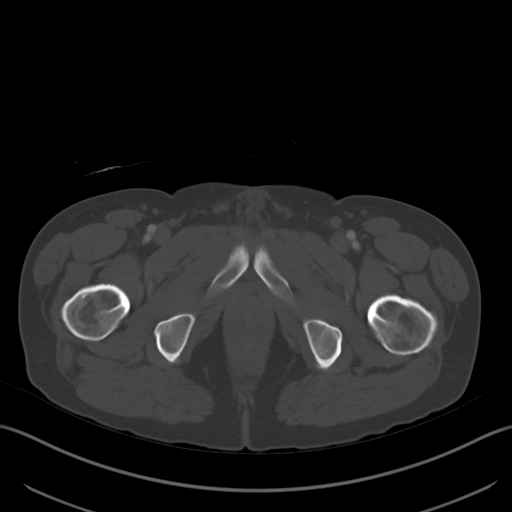
[im 15/93  soft-tissue]
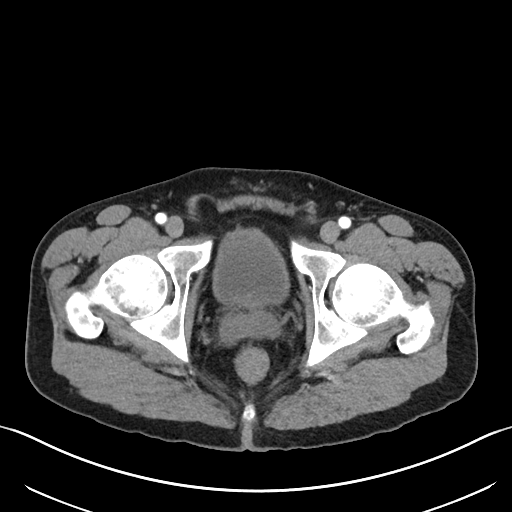
[im 22/93  soft-tissue]
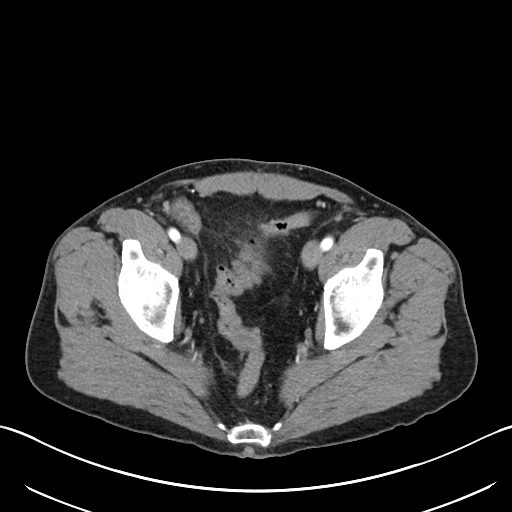
[im 29/93  soft-tissue]
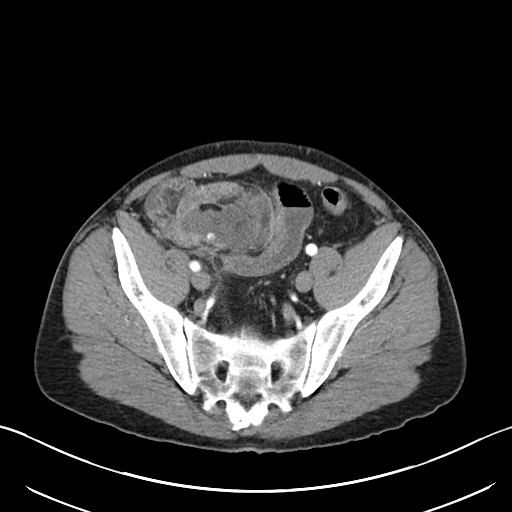
[im 36/93  soft-tissue]
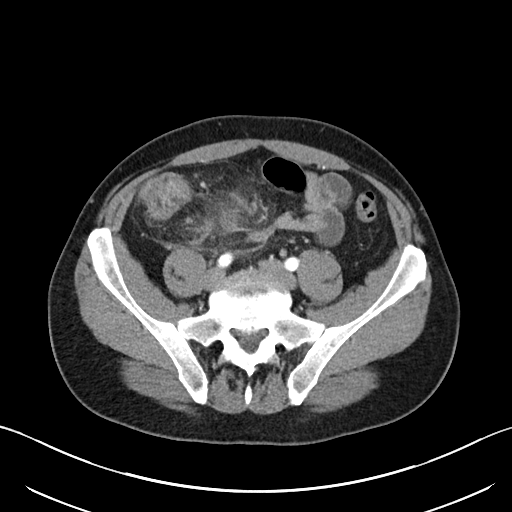
[im 50/93  soft-tissue]
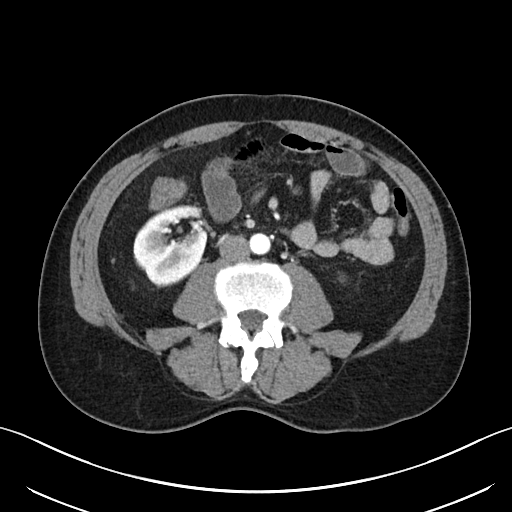
[im 57/93  soft-tissue]
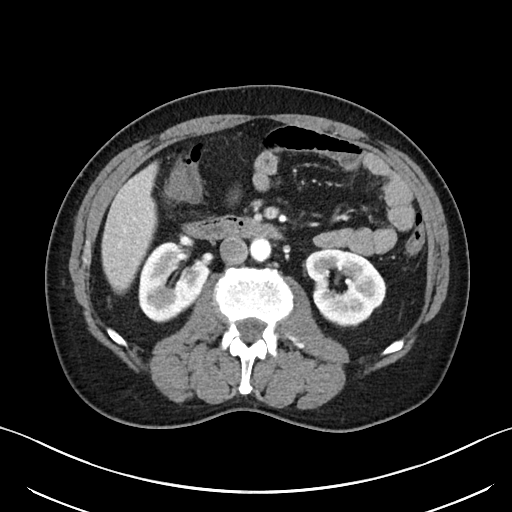
[im 64/93  soft-tissue]
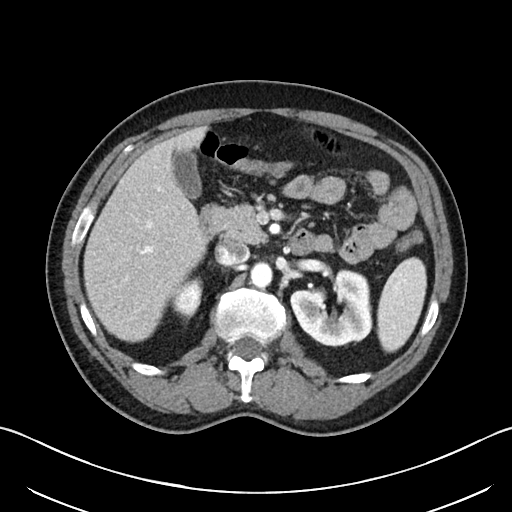
[im 71/93  soft-tissue]
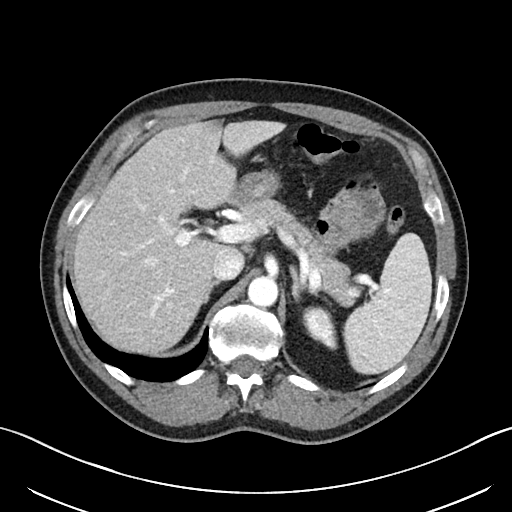
[im 71/93  bone]
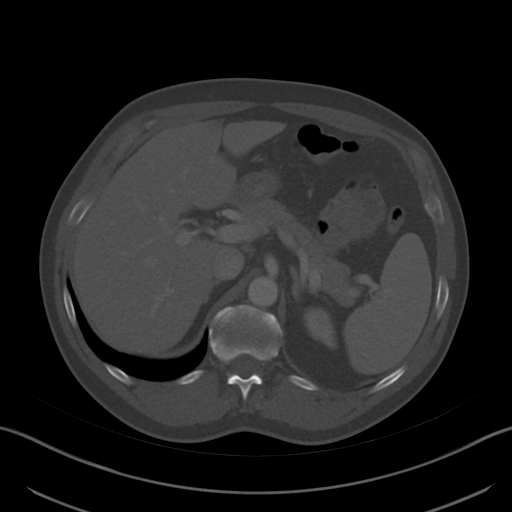
[im 78/93  soft-tissue]
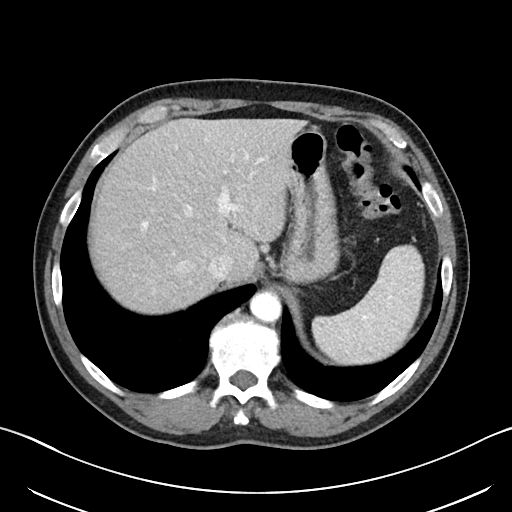
[im 85/93  soft-tissue]
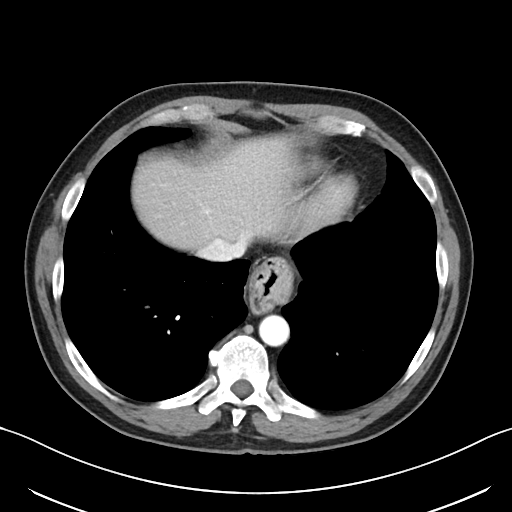

[Series 6: abdomen 3.0 mpr cor · coronal · 0.68mm/px · 3 of 94 slices shown]
[im 32/94  soft-tissue]
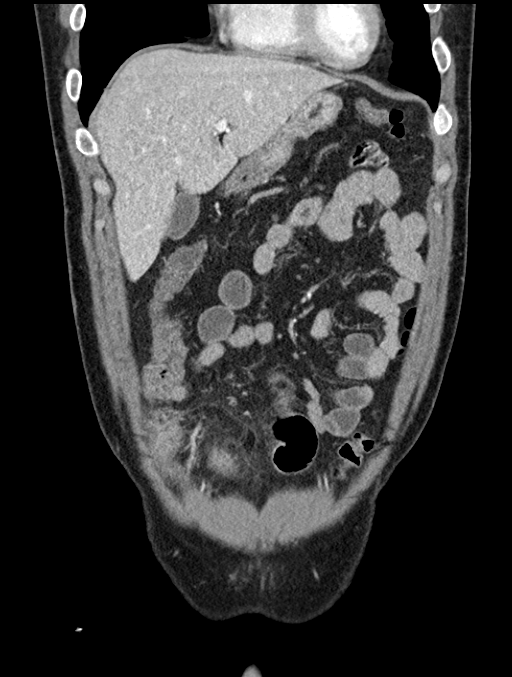
[im 42/94  soft-tissue]
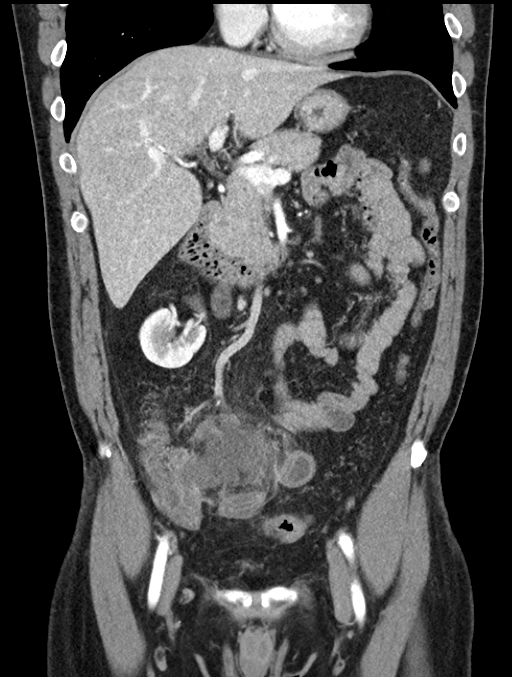
[im 52/94  soft-tissue]
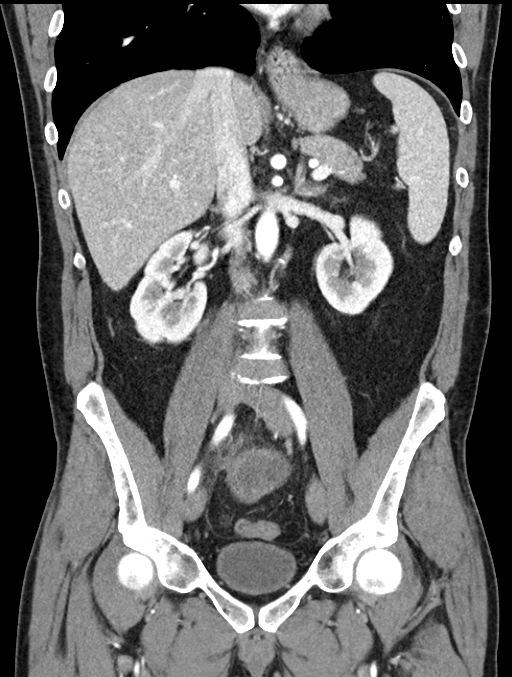

[14 of 46 positions shown; findings below may reference images not displayed]

FINDINGS: Lower chest: The semi solid nodule in the right lower lobe measures
9 x 8 x 13 mm. A high-density punctate nodule is present on image 15
of series 5. There is minimal ground-glass attenuation in the medial
left lower lobe. Heart size is normal. No significant pleural or
pericardial effusion is present.

Hepatobiliary: Fatty infiltration of the liver is noted. No discrete
lesions are present. The common bile duct and gallbladder normal.

Pancreas: Unremarkable. No pancreatic ductal dilatation or
surrounding inflammatory changes.

Spleen: Normal in size without focal abnormality.

Adrenals/Urinary Tract: Adrenal glands are normal bilaterally.
Kidneys are unremarkable. There is no stone or mass lesion. Ureters
are within normal limits. The urinary bladder is normal.

Stomach/Bowel: A moderate-sized hiatal hernia is present. The
stomach is otherwise normal. Duodenum is unremarkable. The proximal
small bowel is normal. Marked inflammatory changes are present at
the level of the appendix. The appendix is thickened, measuring up
to 9 mm. A peri appendiceal mixed density collection measures 5.0 x
6.6 x 4.0 cm. There is secondary and inflammatory changes about the
terminal ileum as well as the sigmoid colon. The more distal
ascending and transverse colon are within normal limits. Descending
colon is unremarkable. Secondary inflammatory changes are present
about portions of the sigmoid colon adjacent to the abscess.

Vascular/Lymphatic: No significant vascular findings are present. No
enlarged abdominal or pelvic lymph nodes.

Reproductive: Prostate is unremarkable.

Other: No abdominal wall hernia or abnormality. No abdominopelvic
ascites.

Musculoskeletal: Mild endplate changes are present at L5-S1. Bony
pelvis is intact. The hips are located and within normal limits
bilaterally.
IMPRESSION: 1. Heterogeneous periappendiceal fluid collection compatible with
contained rupture/abscess measuring up to 6.6 cm.
2. Thickening the appendix compatible with acute/subacute
appendicitis.
3. Secondary inflammatory changes involving the terminal ileum and
sigmoid colon adjacent to the abscess.
4. Moderate-sized hiatal hernia.
5. Semi-solid right lower lobe pulmonary nodule measures 9 x 8 x 13
mm. Follow-up non-contrast CT recommended at 3-6 months to confirm
persistence. If unchanged, and solid component remains <6 mm, annual
CT is recommended until 5 years of stability has been established.
If persistent these nodules should be considered highly suspicious
if the solid component of the nodule is 6 mm or greater in size and
enlarging. This recommendation follows the consensus statement:
Guidelines for Management of Incidental Pulmonary Nodules Detected
[DATE].

These results were called by telephone at the time of interpretation
on 04/12/2018 at [DATE] to Dr. YLSI MANTHJO , who verbally
acknowledged these results.

## 2022-09-15 ENCOUNTER — Other Ambulatory Visit: Payer: Self-pay | Admitting: Family Medicine

## 2022-09-15 DIAGNOSIS — D373 Neoplasm of uncertain behavior of appendix: Secondary | ICD-10-CM

## 2023-08-02 ENCOUNTER — Other Ambulatory Visit (HOSPITAL_COMMUNITY): Payer: Self-pay | Admitting: Family Medicine

## 2023-08-02 DIAGNOSIS — Z136 Encounter for screening for cardiovascular disorders: Secondary | ICD-10-CM

## 2023-09-03 ENCOUNTER — Ambulatory Visit (HOSPITAL_COMMUNITY)
Admission: RE | Admit: 2023-09-03 | Discharge: 2023-09-03 | Disposition: A | Discharge status: Home / Self Care | Payer: Self-pay | Source: Ambulatory Visit | Attending: Family Medicine | Admitting: Family Medicine

## 2023-09-03 DIAGNOSIS — Z136 Encounter for screening for cardiovascular disorders: Secondary | ICD-10-CM | POA: Insufficient documentation
